# Patient Record
Sex: Male | Born: 1990 | Race: Black or African American | Hispanic: No | Marital: Single | Smoking: Current every day smoker
Health system: Southern US, Community
[De-identification: ages and names within clinical notes are randomized; demographics above are authoritative.]

## PROBLEM LIST (undated history)

## (undated) DIAGNOSIS — T7840XA Allergy, unspecified, initial encounter: Secondary | ICD-10-CM

## (undated) DIAGNOSIS — Z8711 Personal history of peptic ulcer disease: Secondary | ICD-10-CM

## (undated) HISTORY — DX: Allergy, unspecified, initial encounter: T78.40XA

---

## 2015-11-01 ENCOUNTER — Emergency Department (HOSPITAL_BASED_OUTPATIENT_CLINIC_OR_DEPARTMENT_OTHER)
Admission: EM | Admit: 2015-11-01 | Discharge: 2015-11-01 | Disposition: A | Discharge status: Home / Self Care | Payer: Self-pay | Attending: Emergency Medicine | Admitting: Emergency Medicine

## 2015-11-01 ENCOUNTER — Encounter (HOSPITAL_BASED_OUTPATIENT_CLINIC_OR_DEPARTMENT_OTHER): Payer: Self-pay

## 2015-11-01 ENCOUNTER — Emergency Department (HOSPITAL_BASED_OUTPATIENT_CLINIC_OR_DEPARTMENT_OTHER): Payer: Self-pay

## 2015-11-01 DIAGNOSIS — F17210 Nicotine dependence, cigarettes, uncomplicated: Secondary | ICD-10-CM | POA: Insufficient documentation

## 2015-11-01 DIAGNOSIS — J029 Acute pharyngitis, unspecified: Secondary | ICD-10-CM | POA: Insufficient documentation

## 2015-11-01 LAB — RAPID STREP SCREEN (MED CTR MEBANE ONLY): STREPTOCOCCUS, GROUP A SCREEN (DIRECT): NEGATIVE

## 2015-11-01 MED ORDER — KETOROLAC TROMETHAMINE 60 MG/2ML IM SOLN
60.0000 mg | Freq: Once | INTRAMUSCULAR | Status: AC
  Administered 2015-11-01: 60 mg via INTRAMUSCULAR
  Filled 2015-11-01: qty 2

## 2015-11-01 NOTE — Discharge Instructions (Signed)
Use OTC medications such as tylenol or motrin for pain control. Use chloraseptic spray for your sore throat. This will numb your throat to provide relief. Return to ED with new, worsening or concerning symptoms.    Pharyngitis Pharyngitis is redness, pain, and swelling (inflammation) of your pharynx.  CAUSES  Pharyngitis is usually caused by infection. Most of the time, these infections are from viruses (viral) and are part of a cold. However, sometimes pharyngitis is caused by bacteria (bacterial). Pharyngitis can also be caused by allergies. Viral pharyngitis may be spread from person to person by coughing, sneezing, and personal items or utensils (cups, forks, spoons, toothbrushes). Bacterial pharyngitis may be spread from person to person by more intimate contact, such as kissing.  SIGNS AND SYMPTOMS  Symptoms of pharyngitis include:   Sore throat.   Tiredness (fatigue).   Low-grade fever.   Headache.  Joint pain and muscle aches.  Skin rashes.  Swollen lymph nodes.  Plaque-like film on throat or tonsils (often seen with bacterial pharyngitis). DIAGNOSIS  Your health care provider will ask you questions about your illness and your symptoms. Your medical history, along with a physical exam, is often all that is needed to diagnose pharyngitis. Sometimes, a rapid strep test is done. Other lab tests may also be done, depending on the suspected cause.  TREATMENT  Viral pharyngitis will usually get better in 3-4 days without the use of medicine. Bacterial pharyngitis is treated with medicines that kill germs (antibiotics).  HOME CARE INSTRUCTIONS   Drink enough water and fluids to keep your urine clear or pale yellow.   Only take over-the-counter or prescription medicines as directed by your health care provider:   If you are prescribed antibiotics, make sure you finish them even if you start to feel better.   Do not take aspirin.   Get lots of rest.   Gargle with 8 oz  of salt water ( tsp of salt per 1 qt of water) as often as every 1-2 hours to soothe your throat.   Throat lozenges (if you are not at risk for choking) or sprays may be used to soothe your throat. SEEK MEDICAL CARE IF:   You have large, tender lumps in your neck.  You have a rash.  You cough up green, yellow-brown, or bloody spit. SEEK IMMEDIATE MEDICAL CARE IF:   Your neck becomes stiff.  You drool or are unable to swallow liquids.  You vomit or are unable to keep medicines or liquids down.  You have severe pain that does not go away with the use of recommended medicines.  You have trouble breathing (not caused by a stuffy nose). MAKE SURE YOU:   Understand these instructions.  Will watch your condition.  Will get help right away if you are not doing well or get worse.   This information is not intended to replace advice given to you by your health care provider. Make sure you discuss any questions you have with your health care provider.   Document Released: 08/14/2005 Document Revised: 06/04/2013 Document Reviewed: 04/21/2013 Elsevier Interactive Patient Education [employer]2016 [employer] Inc[employer]

## 2015-11-01 NOTE — ED Provider Notes (Signed)
CSN: [id number]     Arrival date & time 11/01/15  1417 History   First MD Initiated Contact with Patient 11/01/15 1535     Chief Complaint  Patient presents with  . Sore Throat   HPI  [name] is a 25 year old male presenting with sore throat and cough. He reports onset of symptoms 3 days ago. States that his throat is constantly aching which increases when he tries to swallow. He denies difficulty handling secretions, swallowing or breathing. He states that the pain seems to be worsening. He is also complaining of a cough. He states that occasionally it is dry but sometimes he coughs up rust colored sputum. He is also complaining of generalized myalgias, fatigue and generalized weakness. He denies known sick contacts. He has not taken any medications prior to arrival. He is an every day cigarette and marijuana smoker. Denies fevers, chills, headache, dizziness, syncope, eye pain, nasal congestion, rhinorrhea, neck pain, neck stiffness, chest pain, SOB, abdominal pain, nausea or vomiting. He did not get his flu shot this year.   History reviewed. No pertinent past medical history. History reviewed. No pertinent past surgical history. No family history on file. Social History  Substance Use Topics  . Smoking status: Current Every Day Smoker -- 0.50 packs/day    Types: Cigarettes  . Smokeless tobacco: None  . Alcohol Use: Yes    Review of Systems  Constitutional: Positive for fatigue.  HENT: Positive for sore throat.   Respiratory: Positive for cough.   Musculoskeletal: Positive for myalgias.  Neurological: Positive for weakness.  All other systems reviewed and are negative.     Allergies  Review of patient's allergies indicates no known allergies.  Home Medications   Prior to Admission medications   Not on File   BP 133/89 mmHg  Pulse 82  Temp(Src) 99 F (37.2 C) (Oral)  Resp 16  Ht 5\' 7"  (1.702 m)  Wt 58.968 kg  BMI 20.36 kg/m2  SpO2 100% Physical Exam   Constitutional: He appears well-developed and well-nourished. No distress.  Nontoxic appearing.   HENT:  Head: Normocephalic and atraumatic.  Nose: Nose normal. No mucosal edema or rhinorrhea. Right sinus exhibits no maxillary sinus tenderness and no frontal sinus tenderness. Left sinus exhibits no maxillary sinus tenderness and no frontal sinus tenderness.  Mouth/Throat: Uvula is midline. No uvula swelling. Posterior oropharyngeal erythema present. No oropharyngeal exudate, posterior oropharyngeal edema or tonsillar abscesses.  Eyes: Conjunctivae are normal. Right eye exhibits no discharge. Left eye exhibits no discharge. No scleral icterus.  Neck: Normal range of motion.  Cardiovascular: Normal rate, regular rhythm and normal heart sounds.   Pulmonary/Chest: Effort normal and breath sounds normal. No respiratory distress. He has no wheezes. He has no rales.  Musculoskeletal: Normal range of motion.  Neurological: He is alert. Coordination normal.  Skin: Skin is warm and dry.  Psychiatric: He has a normal mood and affect. His behavior is normal.  Nursing note and vitals reviewed.   ED Course  Procedures (including critical care time) Labs Review Labs Reviewed  RAPID STREP SCREEN (NOT [facility])  CULTURE, GROUP A STREP [facility])    Imaging Review Dg Chest 2 View  11/01/2015  CLINICAL DATA:  Productive cough shortness of breath and chest pain for 3 days, patient smokes EXAM: CHEST  2 VIEW COMPARISON:  None. FINDINGS: The heart size and mediastinal contours are within normal limits. Both lungs are clear. The visualized skeletal structures are unremarkable. IMPRESSION: No active cardiopulmonary disease. Electronically Signed  By: Esperanza Heir M.D.   On: 11/01/2015 16:09   I have personally reviewed and evaluated these images and lab results as part of my medical decision-making.   EKG Interpretation None      MDM   Final diagnoses:  Viral pharyngitis   Pt presenting with cough,  myalgias, sore throat and odynophagia. VSS. Oropharyngeal erythema without exudate. No uvular edema or deviation. No trismus. No cervical lymphadenopathy. Negative strep. Negative CXR. Likely viral pharyngitis. No abx indicated. DC w symptomatic tx for pain to include OTC pain relievers (tylenol, motrin) and chloraseptic spray.  Pt does not appear dehydrated, but did discuss importance of water rehydration. Presentation not concerning for PTA or infxn spread to soft tissues of the neck. Pt able to tolerate PO intake in the ED. Return precautions given in discharge paperwork and discussed with pt at bedside. Pt stable for discharge      Alveta Heimlich, PA-C 11/01/15 1637  Nelva Nay, MD 11/02/15 [phone]

## 2015-11-01 NOTE — ED Notes (Signed)
Pt reports sore throat worsening x 3 days.

## 2015-11-03 LAB — CULTURE, GROUP A STREP (THRC)

## 2016-08-08 ENCOUNTER — Encounter (HOSPITAL_BASED_OUTPATIENT_CLINIC_OR_DEPARTMENT_OTHER): Payer: Self-pay

## 2016-08-08 ENCOUNTER — Emergency Department (HOSPITAL_BASED_OUTPATIENT_CLINIC_OR_DEPARTMENT_OTHER)
Admission: EM | Admit: 2016-08-08 | Discharge: 2016-08-08 | Disposition: A | Discharge status: Home / Self Care | Payer: Self-pay | Attending: Emergency Medicine | Admitting: Emergency Medicine

## 2016-08-08 DIAGNOSIS — F17210 Nicotine dependence, cigarettes, uncomplicated: Secondary | ICD-10-CM | POA: Insufficient documentation

## 2016-08-08 DIAGNOSIS — J020 Streptococcal pharyngitis: Secondary | ICD-10-CM | POA: Insufficient documentation

## 2016-08-08 LAB — RAPID STREP SCREEN (MED CTR MEBANE ONLY): STREPTOCOCCUS, GROUP A SCREEN (DIRECT): POSITIVE — AB

## 2016-08-08 MED ORDER — PENICILLIN G BENZATHINE 1200000 UNIT/2ML IM SUSP
1.2000 10*6.[IU] | Freq: Once | INTRAMUSCULAR | Status: AC
  Administered 2016-08-08: 1.2 10*6.[IU] via INTRAMUSCULAR
  Filled 2016-08-08: qty 2

## 2016-08-08 NOTE — ED Triage Notes (Signed)
Sore throat x 4 days-NAD-steady gait

## 2016-08-08 NOTE — Discharge Instructions (Signed)
You have been treated for strep throat. You may use over-the-counter Chloraseptic and/or salt water gargles to help with throat pain.

## 2016-08-08 NOTE — ED Provider Notes (Signed)
[facility]   CSN: [id number] Arrival date & time: 08/08/16  1146     History   Chief Complaint Chief Complaint  Patient presents with  . Sore Throat    HPI Melvin Levine is a 25 y.o. male.  The history is provided by the patient and medical records.  Sore Throat    25 y.o. M here with sore throat.  Reports this started on Saturday, has been progressively Worsening. Reports wife and daughter have been sick with similar symptoms. States he has felt feverish, but has not checked his temperature. States a significant amount of pain when trying to eat or drink. He has not had any difficult swallowing. No cough, chest pain, shortness of breath, nasal congestion, or ear pain.  History reviewed. No pertinent past medical history.  There are no active problems to display for this patient.   History reviewed. No pertinent surgical history.     Home Medications    Prior to Admission medications   Not on File    Family History No family history on file.  Social History Social History  Substance Use Topics  . Smoking status: Current Every Day Smoker    Packs/day: 0.50    Types: Cigarettes  . Smokeless tobacco: Never Used  . Alcohol use No     Allergies   Patient has no known allergies.   Review of Systems Review of Systems  HENT: Positive for sore throat.   All other systems reviewed and are negative.    Physical Exam Updated Vital Signs BP 141/83 (BP Location: Left Arm)   Pulse 86   Temp 98.4 F (36.9 C) (Oral)   Resp 18   Ht 5\' 8"  (1.727 m)   Wt 59 kg   SpO2 100%   BMI 19.77 kg/m   Physical Exam  Constitutional: He is oriented to person, place, and time. He appears well-developed and well-nourished.  HENT:  Head: Normocephalic and atraumatic.  Mouth/Throat: Oropharynx is clear and moist.  Tonsils 2+ bilaterally with exudates; uvula midline without evidence of peritonsillar abscess; handling secretions appropriately; no  difficulty swallowing or speaking; normal phonation without stridor  Eyes: Conjunctivae and EOM are normal. Pupils are equal, round, and reactive to light.  Neck: Normal range of motion.  Cardiovascular: Normal rate, regular rhythm and normal heart sounds.   Pulmonary/Chest: Effort normal and breath sounds normal.  Abdominal: Soft. Bowel sounds are normal.  Musculoskeletal: Normal range of motion.  Neurological: He is alert and oriented to person, place, and time.  Skin: Skin is warm and dry.  Psychiatric: He has a normal mood and affect.  Nursing note and vitals reviewed.    ED Treatments / Results  Labs (all labs ordered are listed, but only abnormal results are displayed) Labs Reviewed  RAPID STREP SCREEN (NOT AT [facility]) - Abnormal; Notable for the following:       Result Value   Streptococcus, Group A Screen (Direct) POSITIVE (*)    All other components within normal limits    EKG  EKG Interpretation None       Radiology No results found.  Procedures Procedures (including critical care time)  Medications Ordered in ED Medications - No data to display   Initial Impression / Assessment and Plan / ED Course  I have reviewed the triage vital signs and the nursing notes.  Pertinent labs & imaging results that were available during my care of the patient were reviewed by me and considered in my  medical decision making (see chart for details).  Clinical Course    [age] year old male here with sore throat for the past several days. Daughter and wife have been sick as well. He is afebrile and nontoxic. Tonsils are 2+ bilaterally with exudates noted. Uvula remains midline. Handling secretions well, normal phonation without stridor. Rapid strep test is positive. No signs or symptoms concerning for peritonsillar abscess at this time. Patient was treated here with Bicillin. Will discharge home with supportive care.  Discussed plan with patient, he acknowledged understanding and  agreed with plan of care.  Return precautions given for new or worsening symptoms.  Final Clinical Impressions(s) / ED Diagnoses   Final diagnoses:  Strep pharyngitis    New Prescriptions New Prescriptions   No medications on file     Garlon HatchetLisa M Sanders, Cordelia Poche-C 08/08/16 1321    Canary Brimhristopher J Tegeler, MD 08/08/16 2037

## 2017-08-28 HISTORY — PX: MANDIBLE SURGERY: SHX707

## 2017-08-28 HISTORY — PX: SHOULDER ARTHROSCOPY W/ ROTATOR CUFF REPAIR: SHX2400

## 2017-10-04 ENCOUNTER — Other Ambulatory Visit: Payer: Self-pay

## 2017-10-04 ENCOUNTER — Emergency Department (HOSPITAL_BASED_OUTPATIENT_CLINIC_OR_DEPARTMENT_OTHER): Payer: Self-pay

## 2017-10-04 ENCOUNTER — Encounter (HOSPITAL_BASED_OUTPATIENT_CLINIC_OR_DEPARTMENT_OTHER): Payer: Self-pay | Admitting: *Deleted

## 2017-10-04 ENCOUNTER — Emergency Department (HOSPITAL_BASED_OUTPATIENT_CLINIC_OR_DEPARTMENT_OTHER)
Admission: EM | Admit: 2017-10-04 | Discharge: 2017-10-04 | Disposition: A | Discharge status: Home / Self Care | Payer: Self-pay | Attending: Emergency Medicine | Admitting: Emergency Medicine

## 2017-10-04 DIAGNOSIS — F17210 Nicotine dependence, cigarettes, uncomplicated: Secondary | ICD-10-CM | POA: Insufficient documentation

## 2017-10-04 DIAGNOSIS — R1031 Right lower quadrant pain: Secondary | ICD-10-CM | POA: Insufficient documentation

## 2017-10-04 LAB — CBC WITH DIFFERENTIAL/PLATELET
BASOS ABS: 0 10*3/uL (ref 0.0–0.1)
Basophils Relative: 0 %
EOS ABS: 0.1 10*3/uL (ref 0.0–0.7)
Eosinophils Relative: 2 %
HCT: 46.6 % (ref 39.0–52.0)
Hemoglobin: 16 g/dL (ref 13.0–17.0)
Lymphocytes Relative: 25 %
Lymphs Abs: 1.4 10*3/uL (ref 0.7–4.0)
MCH: 29.2 pg (ref 26.0–34.0)
MCHC: 34.3 g/dL (ref 30.0–36.0)
MCV: 85 fL (ref 78.0–100.0)
MONO ABS: 0.5 10*3/uL (ref 0.1–1.0)
Monocytes Relative: 9 %
NEUTROS PCT: 64 %
Neutro Abs: 3.4 10*3/uL (ref 1.7–7.7)
PLATELETS: 231 10*3/uL (ref 150–400)
RBC: 5.48 MIL/uL (ref 4.22–5.81)
RDW: 13 % (ref 11.5–15.5)
WBC: 5.4 10*3/uL (ref 4.0–10.5)

## 2017-10-04 LAB — COMPREHENSIVE METABOLIC PANEL
ALK PHOS: 90 U/L (ref 38–126)
ALT: 17 U/L (ref 17–63)
ANION GAP: 6 (ref 5–15)
AST: 18 U/L (ref 15–41)
Albumin: 4.2 g/dL (ref 3.5–5.0)
BILIRUBIN TOTAL: 1.5 mg/dL — AB (ref 0.3–1.2)
BUN: 19 mg/dL (ref 6–20)
CALCIUM: 8.9 mg/dL (ref 8.9–10.3)
CO2: 28 mmol/L (ref 22–32)
Chloride: 103 mmol/L (ref 101–111)
Creatinine, Ser: 0.93 mg/dL (ref 0.61–1.24)
GFR calc non Af Amer: >60 mL/min (ref >60)
Glucose, Bld: 79 mg/dL (ref 65–99)
Potassium: 3.7 mmol/L (ref 3.5–5.1)
SODIUM: 137 mmol/L (ref 135–145)
Total Protein: 8.3 g/dL — ABNORMAL HIGH (ref 6.5–8.1)

## 2017-10-04 LAB — URINALYSIS, ROUTINE W REFLEX MICROSCOPIC
Glucose, UA: NEGATIVE mg/dL
Ketones, ur: 15 mg/dL — AB
NITRITE: NEGATIVE
PROTEIN: 30 mg/dL — AB
Specific Gravity, Urine: 1.025 (ref 1.005–1.030)
pH: 6.5 (ref 5.0–8.0)

## 2017-10-04 LAB — URINALYSIS, MICROSCOPIC (REFLEX)

## 2017-10-04 LAB — OCCULT BLOOD X 1 CARD TO LAB, STOOL: FECAL OCCULT BLD: NEGATIVE

## 2017-10-04 LAB — LIPASE, BLOOD: Lipase: 19 U/L (ref 11–51)

## 2017-10-04 MED ORDER — SODIUM CHLORIDE 0.9 % IV BOLUS (SEPSIS)
500.0000 mL | Freq: Once | INTRAVENOUS | Status: AC
  Administered 2017-10-04: 500 mL via INTRAVENOUS

## 2017-10-04 MED ORDER — IOPAMIDOL (ISOVUE-300) INJECTION 61%
100.0000 mL | Freq: Once | INTRAVENOUS | Status: AC | PRN
  Administered 2017-10-04: 100 mL via INTRAVENOUS

## 2017-10-04 MED ORDER — PANTOPRAZOLE SODIUM 20 MG PO TBEC: 20.0000 mg | DELAYED_RELEASE_TABLET | Freq: Every day | ORAL | 1 refills | Status: DC

## 2017-10-04 MED ORDER — SUCRALFATE 1 G PO TABS: 1.0000 g | ORAL_TABLET | Freq: Three times a day (TID) | ORAL | 0 refills | Status: DC

## 2017-10-04 MED FILL — SUCRALFATE 1 GM TABLET: 1 | 14 days supply | Qty: 56 | Fill #0

## 2017-10-04 MED FILL — PANTOPRAZOLE SOD DR 20 MG T: 20 | 30 days supply | Qty: 30 | Fill #0

## 2017-10-04 NOTE — Discharge Instructions (Signed)

## 2017-10-04 NOTE — ED Notes (Signed)
Patient returned to room from CT. 

## 2017-10-04 NOTE — ED Notes (Signed)
ED Provider at bedside. 

## 2017-10-04 NOTE — ED Provider Notes (Signed)
Emergency Department Provider Note   I have reviewed the triage vital signs and the nursing notes.   HISTORY  Chief Complaint Abdominal Pain   HPI Melvin Levine is a 27 y.o. male presents to the emergency department for evaluation of continued abdominal pain in the right lower quadrant and blood in the stools.  She states that 2 weeks ago he went on a drinking binge for his birthday.  He has not continued to drink but since that time is had right-sided abdominal pain and is seen blood in the bowel movements.  He continues to have regular bowel movements but states sometimes he sees light red blood and other times it is dark red.  He denies any black, tarry stools.  Denies rectal pain.  He does have a history of hemorrhoids.  Denies fevers or chills.  No epigastric abdominal pain.  No nausea or vomiting. No sick contacts. No history of GI bleeding in the past.   History reviewed. No pertinent past medical history.  There are no active problems to display for this patient.   History reviewed. No pertinent surgical history.    Allergies Patient has no known allergies.  No family history on file.  Social History Social History   Tobacco Use  . Smoking status: Current Every Day Smoker    Packs/day: 0.50    Types: Cigarettes  . Smokeless tobacco: Never Used  Substance Use Topics  . Alcohol use: No  . Drug use: No    Review of Systems  Constitutional: No fever/chills Eyes: No visual changes. ENT: No sore throat. Cardiovascular: Denies chest pain. Respiratory: Denies shortness of breath. Gastrointestinal: Positive RLQ abdominal pain.  No nausea, no vomiting.  No diarrhea.  No constipation. Positive blood in the stool.  Genitourinary: Negative for dysuria. Musculoskeletal: Negative for back pain. Skin: Negative for rash. Neurological: Negative for headaches, focal weakness or numbness.  10-point ROS otherwise  negative.  ____________________________________________   PHYSICAL EXAM:  VITAL SIGNS: ED Triage Vitals  Enc Vitals Group     BP 10/04/17 1148 134/88     Pulse Rate 10/04/17 1148 70     Resp 10/04/17 1148 18     Temp 10/04/17 1148 98.3 F (36.8 C)     Temp Source 10/04/17 1148 Oral     SpO2 10/04/17 1148 99 %     Weight 10/04/17 1147 130 lb (59 kg)     Height 10/04/17 1147 5\' 8"  (1.727 m)     Pain Score 10/04/17 1147 6   Constitutional: Alert and oriented. Well appearing and in no acute distress. Eyes: Conjunctivae are normal. Head: Atraumatic. Nose: No congestion/rhinnorhea. Mouth/Throat: Mucous membranes are moist.   Neck: No stridor.   Cardiovascular: Normal rate, regular rhythm. Good peripheral circulation. Grossly normal heart sounds.   Respiratory: Normal respiratory effort.  No retractions. Lungs CTAB. Gastrointestinal: Soft with focal RLQ abdominal pain. No distention. Rectal exam performed with nurse present. Normal exam. No gross blood or melena. No external hemorrhoids. No fissures. No abscesses.  Musculoskeletal: No lower extremity tenderness nor edema. No gross deformities of extremities. Neurologic:  Normal speech and language. No gross focal neurologic deficits are appreciated.  Skin:  Skin is warm, dry and intact. No rash noted.  ____________________________________________   LABS (all labs ordered are listed, but only abnormal results are displayed)  Labs Reviewed  COMPREHENSIVE METABOLIC PANEL - Abnormal; Notable for the following components:      Result Value   Total Protein 8.3 (*)  Total Bilirubin 1.5 (*)    All other components within normal limits  URINALYSIS, ROUTINE W REFLEX MICROSCOPIC - Abnormal; Notable for the following components:   Hgb urine dipstick TRACE (*)    Bilirubin Urine SMALL (*)    Ketones, ur 15 (*)    Protein, ur 30 (*)    Leukocytes, UA TRACE (*)    All other components within normal limits  URINALYSIS, MICROSCOPIC  (REFLEX) - Abnormal; Notable for the following components:   Bacteria, UA FEW (*)    Squamous Epithelial / LPF 0-5 (*)    All other components within normal limits  LIPASE, BLOOD  CBC WITH DIFFERENTIAL/PLATELET  OCCULT BLOOD X 1 CARD TO LAB, STOOL  POC OCCULT BLOOD, ED   ____________________________________________  RADIOLOGY  Ct Abdomen Pelvis W Contrast  Result Date: 10/04/2017 CLINICAL DATA:  RIGHT lower quadrant pain for 4 days, some bright red blood in stools EXAM: CT ABDOMEN AND PELVIS WITH CONTRAST TECHNIQUE: Multidetector CT imaging of the abdomen and pelvis was performed using the standard protocol following bolus administration of intravenous contrast. Sagittal and coronal MPR images reconstructed from axial data set. CONTRAST:  ISOVUE-300 IOPAMIDOL (ISOVUE-300) INJECTION 61% IV. Dilute oral contrast. COMPARISON:  08/14/2014 FINDINGS: Lower chest: Lung bases clear Hepatobiliary: Gallbladder and liver normal appearance Pancreas: Normal appearance Spleen: Normal appearance Adrenals/Urinary Tract: Adrenal glands, kidneys, and decompressed bladder normal appearance. Ureters poorly visualized, no definite calculi seen along the expected courses of the ureters. Stomach/Bowel: Question wall thickening of the distal gastric antrum/pylorus less likely artifact related to underdistention. Mild gaseous distention of rectum. Short segment of appendix is visualized and normal in appearance, remainder obscured by adjacent bowel loops. No definite pericecal inflammatory process. Bowel loops otherwise unremarkable. Vascular/Lymphatic: Vascular structures unremarkable. No definite adenopathy. Reproductive: N/A Other: No free air or free fluid.  No hernia. Musculoskeletal: Bones unremarkable. IMPRESSION: Questionable mild wall thickening of the distal gastric antrum and pylorus which could be related to gastritis or ulcer disease. Remainder of exam unremarkable. Electronically Signed   By: Ulyses Southward  M.D.   On: 10/04/2017 13:00    ____________________________________________   PROCEDURES  Procedure(s) performed:   Procedures  None ____________________________________________   INITIAL IMPRESSION / ASSESSMENT AND PLAN / ED COURSE  Pertinent labs & imaging results that were available during my care of the patient were reviewed by me and considered in my medical decision making (see chart for details).  Patient presents to the emergency department for evaluation of right lower quadrant abdominal pain with report of blood in the stools.  Symptoms progressing over the last 2 weeks.  He has focal tenderness in the right lower quadrant without rebound or guarding.  Rectal exam is largely unremarkable.  No fever or chills.  Plan for CT to evaluate for underlying colitis with complication.  Labs pending.  01:23 PM Scan reviewed with findings consistent with gastritis.  No explanation for the right lower quadrant pain.  No blood on Hemoccult.  Labs otherwise unremarkable.  Plan for treatment of ulcer disease and referral to primary care physician.  Patient may require additional follow-up with gastroenterology after seeing the primary care physician. Discussed this with him in detail.   At this time, I do not feel there is any life-threatening condition present. I have reviewed and discussed all results (EKG, imaging, lab, urine as appropriate), exam findings with patient. I have reviewed nursing notes and appropriate previous records.  I feel the patient is safe to be discharged home without  further emergent workup. Discussed usual and customary return precautions. Patient and family (if present) verbalize understanding and are comfortable with this plan.  Patient will follow-up with their primary care provider. If they do not have a primary care provider, information for follow-up has been provided to them. All questions have been  answered.  ____________________________________________  FINAL CLINICAL IMPRESSION(S) / ED DIAGNOSES  Final diagnoses:  Right lower quadrant abdominal pain     MEDICATIONS GIVEN DURING THIS VISIT:  Medications  sodium chloride 0.9 % bolus 500 mL (0 mLs Intravenous Stopped 10/04/17 1254)  iopamidol (ISOVUE-300) 61 % injection 100 mL (100 mLs Intravenous Contrast Given 10/04/17 1240)     NEW OUTPATIENT MEDICATIONS STARTED DURING THIS VISIT:  Discharge Medication List as of 10/04/2017  1:26 PM    START taking these medications   Details  pantoprazole (PROTONIX) 20 MG tablet Take 1 tablet (20 mg total) by mouth daily., Starting Thu 10/04/2017, Until Sat 11/03/2017, Print    sucralfate (CARAFATE) 1 g tablet Take 1 tablet (1 g total) by mouth 4 (four) times daily -  with meals and at bedtime for 14 days., Starting Thu 10/04/2017, Until Thu 10/18/2017, Print        Note:  This document was prepared using Dragon voice recognition software and may include unintentional dictation errors.  Alona Bene, MD Emergency Medicine    Alistar Mcenery, Arlyss Repress, MD 10/04/17 [phone]

## 2017-10-04 NOTE — ED Triage Notes (Signed)
Abdominal pain since drinking a large amount of alcohol a week ago. States he sees bright red blood in his stools at times.

## 2017-12-09 ENCOUNTER — Encounter (HOSPITAL_BASED_OUTPATIENT_CLINIC_OR_DEPARTMENT_OTHER): Payer: Self-pay | Admitting: *Deleted

## 2017-12-09 ENCOUNTER — Emergency Department (HOSPITAL_BASED_OUTPATIENT_CLINIC_OR_DEPARTMENT_OTHER)
Admission: EM | Admit: 2017-12-09 | Discharge: 2017-12-09 | Disposition: A | Discharge status: Home / Self Care | Payer: Medicaid Other | Attending: Emergency Medicine | Admitting: Emergency Medicine

## 2017-12-09 ENCOUNTER — Other Ambulatory Visit: Payer: Self-pay

## 2017-12-09 DIAGNOSIS — N4889 Other specified disorders of penis: Secondary | ICD-10-CM | POA: Insufficient documentation

## 2017-12-09 DIAGNOSIS — R21 Rash and other nonspecific skin eruption: Secondary | ICD-10-CM | POA: Insufficient documentation

## 2017-12-09 DIAGNOSIS — Z8619 Personal history of other infectious and parasitic diseases: Secondary | ICD-10-CM | POA: Insufficient documentation

## 2017-12-09 DIAGNOSIS — Z113 Encounter for screening for infections with a predominantly sexual mode of transmission: Secondary | ICD-10-CM | POA: Insufficient documentation

## 2017-12-09 LAB — URINALYSIS, MICROSCOPIC (REFLEX)

## 2017-12-09 LAB — URINALYSIS, ROUTINE W REFLEX MICROSCOPIC
Glucose, UA: NEGATIVE mg/dL
Ketones, ur: 15 mg/dL — AB
NITRITE: NEGATIVE
Protein, ur: 30 mg/dL — AB
pH: 6 (ref 5.0–8.0)

## 2017-12-09 MED ORDER — PENICILLIN G BENZATHINE 1200000 UNIT/2ML IM SUSP
1.2000 10*6.[IU] | Freq: Once | INTRAMUSCULAR | Status: AC
  Administered 2017-12-09: 1.2 10*6.[IU] via INTRAMUSCULAR
  Filled 2017-12-09: qty 2

## 2017-12-09 MED ORDER — DOXYCYCLINE HYCLATE 100 MG PO CAPS: 100.0000 mg | ORAL_CAPSULE | Freq: Two times a day (BID) | ORAL | 0 refills | Status: DC

## 2017-12-09 NOTE — ED Provider Notes (Signed)
[facility] EMERGENCY DEPARTMENT Provider Note   CSN: [id number] Arrival date & time: 12/09/17  1651     History   Chief Complaint Chief Complaint  Patient presents with  . Penile Discharge    HPI Melvin Levine is a 27 y.o. male.  HPI   27 year old male presenting for evaluation of a rash.  Patient states 3 days ago he started noticing a rash around his neck.  The next day rash appears in his anterior abdomen and now he noticed rash at the tip of his penis.  The rash is not really itchy or painful.  He did report having somewhat of a similar rash before.  He denies any associated fever, headache, trouble swallowing, chest pain, trouble breathing, abdominal cramping, dysuria, hematuria, penile discharge or testicular pain.  He does admits to having multiple male partners and was diagnosed with gonorrhea a month ago and received treatment for that.  He denies any change in his environment, no new soap, detergent, body rash or new injuries.   History reviewed. No pertinent past medical history.  There are no active problems to display for this patient.   History reviewed. No pertinent surgical history.      Home Medications    Prior to Admission medications   Medication Sig Start Date End Date Taking? Authorizing Provider  pantoprazole (PROTONIX) 20 MG tablet Take 1 tablet (20 mg total) by mouth daily. 10/04/17 11/03/17  Long, Arlyss RepressJoshua G, MD  sucralfate (CARAFATE) 1 g tablet Take 1 tablet (1 g total) by mouth 4 (four) times daily -  with meals and at bedtime for 14 days. 10/04/17 10/18/17  Long, Arlyss RepressJoshua G, MD    Family History History reviewed. No pertinent family history.  Social History Social History   Tobacco Use  . Smoking status: Current Every Day Smoker    Packs/day: 0.50    Types: Cigarettes  . Smokeless tobacco: Never Used  Substance Use Topics  . Alcohol use: No  . Drug use: Yes    Types: Marijuana     Allergies   Patient has no known  allergies.   Review of Systems Review of Systems  All other systems reviewed and are negative.    Physical Exam Updated Vital Signs BP (!) 141/76 (BP Location: Left Arm)   Pulse 77   Temp 99.1 F (37.3 C) (Oral)   Resp 16   Ht 5\' 7"  (1.702 m)   Wt 61.2 kg (135 lb)   SpO2 100%   BMI 21.14 kg/m   Physical Exam  Constitutional: He appears well-developed and well-nourished. No distress.  HENT:  Mouth/Throat: Oropharynx is clear and moist.  Eyes: Conjunctivae are normal.  Neck: Normal range of motion. Neck supple.  No nuchal rigidity  Pulmonary/Chest: Effort normal.  Abdominal: Soft. There is no tenderness.  Genitourinary:  Genitourinary Comments: Chaperone present during exam.  Circumcised penis.  Multiple skin colored papular lesion noted to the head of penis nontender to palpation.  No inguinal lymphopathy or inguinal hernia noted.  Normal testicle with normal lie, scrotum nontender.  Skin: Rash (Multiple skin colored papules noted surrounding the anterior neck bilaterally without any erythema.  Several 1 cm size raised papule to anterior abdomen with central clearing.) noted.  Nursing note and vitals reviewed.    ED Treatments / Results  Labs (all labs ordered are listed, but only abnormal results are displayed) Labs Reviewed  URINALYSIS, ROUTINE W REFLEX MICROSCOPIC - Abnormal; Notable for the following components:  Result Value   Color, Urine AMBER (*)    Specific Gravity, Urine >1.030 (*)    Hgb urine dipstick TRACE (*)    Bilirubin Urine SMALL (*)    Ketones, ur 15 (*)    Protein, ur 30 (*)    Leukocytes, UA TRACE (*)    All other components within normal limits  URINALYSIS, MICROSCOPIC (REFLEX) - Abnormal; Notable for the following components:   Bacteria, UA RARE (*)    Squamous Epithelial / LPF 0-5 (*)    All other components within normal limits  RPR  HIV ANTIBODY (ROUTINE TESTING)  GC/CHLAMYDIA PROBE AMP ([place]) NOT AT [facility]     EKG None  Radiology No results found.  Procedures Procedures (including critical care time)  Medications Ordered in ED Medications  penicillin g benzathine (BICILLIN LA) 1200000 UNIT/2ML injection 1.2 Million Units (1.2 Million Units Intramuscular Given 12/09/17 1854)     Initial Impression / Assessment and Plan / ED Course  I have reviewed the triage vital signs and the nursing notes.  Pertinent labs & imaging results that were available during my care of the patient were reviewed by me and considered in my medical decision making (see chart for details).     BP (!) 141/76 (BP Location: Left Arm)   Pulse 77   Temp 99.1 F (37.3 C) (Oral)   Resp 16   Ht 5\' 7"  (1.702 m)   Wt 61.2 kg (135 lb)   SpO2 100%   BMI 21.14 kg/m    Final Clinical Impressions(s) / ED Diagnoses   Final diagnoses:  Rash and nonspecific skin eruption    ED Discharge Orders        Ordered    doxycycline (VIBRAMYCIN) 100 MG capsule  2 times daily     12/09/17 1853     6:52 PM Patient with prior history of STI here with multiple abnormal skin rash to his neck, abdomen, and also at the tip of his penis.  No penile discharge joint pain headache or fever.  Given his abnormal rash, or give Biaxin 2,400,000 units to cover for potential syphilis.  STI testing ordered.  Will discharge home with doxycycline to cover for potential STI related rash.  Return precautions discussed.  No environmental changes noted.  Doubt SJS or TEN.  Pt is well appearing.  Return precaution given.     Fayrene Helper, PA-C 12/09/17 1854    Tilden Fossa, MD 12/10/17 [phone]

## 2017-12-09 NOTE — ED Triage Notes (Signed)
Penile rash that started today.

## 2017-12-09 NOTE — Discharge Instructions (Addendum)
You have developed a nonspecific rash.  Please take antibiotic as prescribed to cover for potential infection.  Return if condition worsen or if you have any other concerns.

## 2017-12-10 LAB — GC/CHLAMYDIA PROBE AMP (~~LOC~~) NOT AT ARMC
CHLAMYDIA, DNA PROBE: NEGATIVE
Neisseria Gonorrhea: NEGATIVE

## 2017-12-11 LAB — HIV ANTIBODY (ROUTINE TESTING W REFLEX): HIV Screen 4th Generation wRfx: NONREACTIVE

## 2017-12-11 LAB — RPR: RPR Ser Ql: NONREACTIVE

## 2017-12-13 ENCOUNTER — Other Ambulatory Visit: Payer: Self-pay

## 2017-12-13 ENCOUNTER — Encounter (HOSPITAL_BASED_OUTPATIENT_CLINIC_OR_DEPARTMENT_OTHER): Payer: Self-pay | Admitting: *Deleted

## 2017-12-13 ENCOUNTER — Emergency Department (HOSPITAL_BASED_OUTPATIENT_CLINIC_OR_DEPARTMENT_OTHER)
Admission: EM | Admit: 2017-12-13 | Discharge: 2017-12-13 | Disposition: A | Discharge status: Home / Self Care | Payer: Medicaid Other | Attending: Emergency Medicine | Admitting: Emergency Medicine

## 2017-12-13 DIAGNOSIS — L2082 Flexural eczema: Secondary | ICD-10-CM

## 2017-12-13 DIAGNOSIS — R21 Rash and other nonspecific skin eruption: Secondary | ICD-10-CM

## 2017-12-13 DIAGNOSIS — Z5321 Procedure and treatment not carried out due to patient leaving prior to being seen by health care provider: Secondary | ICD-10-CM | POA: Insufficient documentation

## 2017-12-13 MED ORDER — TRIAMCINOLONE ACETONIDE 0.1 % EX CREA: 1.0000 "application " | TOPICAL_CREAM | Freq: Two times a day (BID) | CUTANEOUS | 0 refills | Status: DC

## 2017-12-13 MED FILL — TRIAMCINOLONE 0.1% CREAM: 0.1 | 30 days supply | Qty: 45 | Fill #0

## 2017-12-13 NOTE — Discharge Instructions (Addendum)
Return here as needed. I would stop the other cream and the antibiotics.

## 2017-12-13 NOTE — ED Triage Notes (Signed)
Rash on his arms, neck and abdomen. States he was seen for same 3 days ago and given medication that has helped but it has not completely gone away. No distress.

## 2017-12-17 NOTE — ED Provider Notes (Signed)
[facility] EMERGENCY DEPARTMENT Provider Note   CSN: [id number] Arrival date & time: 12/13/17  1111     History   Chief Complaint Chief Complaint  Patient presents with  . Rash    HPI Melvin Levine is a 27 y.o. male.  HPI Patient presents to the emergency department with rash to his arms neck and abdomen.  The patient was started on treatment for possible STDs and given antibiotics for this rash.  The patient states that the rash does not seem to be vastly improved at this time.  Patient states some of the rash has gotten better but not completely.  Patient states that he has taken all of the medications as prescribed.  Patient denies having any fever, nausea, vomiting, weakness, dizziness, headache, blurred vision or syncope. History reviewed. No pertinent past medical history.  There are no active problems to display for this patient.   History reviewed. No pertinent surgical history.      Home Medications    Prior to Admission medications   Medication Sig Start Date End Date Taking? Authorizing Provider  doxycycline (VIBRAMYCIN) 100 MG capsule Take 1 capsule (100 mg total) by mouth 2 (two) times daily. One po bid x 7 days 12/09/17  Yes Fayrene Helper, PA-C  pantoprazole (PROTONIX) 20 MG tablet Take 1 tablet (20 mg total) by mouth daily. 10/04/17 11/03/17  Long, Arlyss Repress, MD  sucralfate (CARAFATE) 1 g tablet Take 1 tablet (1 g total) by mouth 4 (four) times daily -  with meals and at bedtime for 14 days. 10/04/17 10/18/17  Long, Arlyss Repress, MD  triamcinolone cream (KENALOG) 0.1 % Apply 1 application topically 2 (two) times daily. 12/13/17   Charlestine Night, PA-C    Family History No family history on file.  Social History Social History   Tobacco Use  . Smoking status: Current Every Day Smoker    Packs/day: 0.50    Types: Cigarettes  . Smokeless tobacco: Never Used  Substance Use Topics  . Alcohol use: No  . Drug use: Yes    Types: Marijuana     Allergies     Patient has no known allergies.   Review of Systems Review of Systems All other systems negative except as documented in the HPI. All pertinent positives and negatives as reviewed in the HPI.  Physical Exam Updated Vital Signs BP (!) 140/94   Pulse 74   Temp 98 F (36.7 C) (Oral)   Resp 18   Ht 5\' 7"  (1.702 m)   Wt 61.2 kg (135 lb)   SpO2 100%   BMI 21.14 kg/m   Physical Exam  Constitutional: He is oriented to person, place, and time. He appears well-developed and well-nourished. No distress.  HENT:  Head: Normocephalic and atraumatic.  Eyes: Pupils are equal, round, and reactive to light.  Pulmonary/Chest: Effort normal.  Neurological: He is alert and oriented to person, place, and time.  Skin: Skin is warm and dry. Rash noted.     Psychiatric: He has a normal mood and affect.  Nursing note and vitals reviewed.    ED Treatments / Results  Labs (all labs ordered are listed, but only abnormal results are displayed) Labs Reviewed - No data to display  EKG None  Radiology No results found.  Procedures Procedures (including critical care time)  Medications Ordered in ED Medications - No data to display   Initial Impression / Assessment and Plan / ED Course  I have reviewed the triage vital signs  and the nursing notes.  Pertinent labs & imaging results that were available during my care of the patient were reviewed by me and considered in my medical decision making (see chart for details).     Patient will be treated for an eczema type rash based on the fact that he has had a history of this in the past along with the appearance of this rash.  Patient is advised that the antibiotics will not help with this rash more than likely.  The patient is advised the plan and all questions were answered. Final Clinical Impressions(s) / ED Diagnoses   Final diagnoses:  Rash  Flexural eczema    ED Discharge Orders        Ordered    triamcinolone cream (KENALOG)  0.1 %  2 times daily     12/13/17 [address]1322       Treyana Sturgell, PA-C 12/17/17 1507    Melene PlanFloyd, Dan, DO 12/17/17 1537

## 2019-06-26 IMAGING — CT CT ABD-PELV W/ CM
2 of 4 series · 16 of 46 positions shown, 18 images · IV contrast (APPLIED)
Comparison: 08/14/2014

CLINICAL DATA: RIGHT lower quadrant pain for 4 days, some bright
red blood in stools

EXAM:
CT ABDOMEN AND PELVIS WITH CONTRAST
TECHNIQUE: Multidetector CT imaging of the abdomen and pelvis was performed
using the standard protocol following bolus administration of
intravenous contrast. Sagittal and coronal MPR images reconstructed
from axial data set.
CONTRAST:  100mL 57DC9L-RFF IOPAMIDOL (57DC9L-RFF) INJECTION 61% IV.
Dilute oral contrast.

[Series 2: axial st · axial · 0.66mm/px · z∈[-434,-54]mm · 13 of 84 slices shown, 15 images]
[im 4/84  soft-tissue]
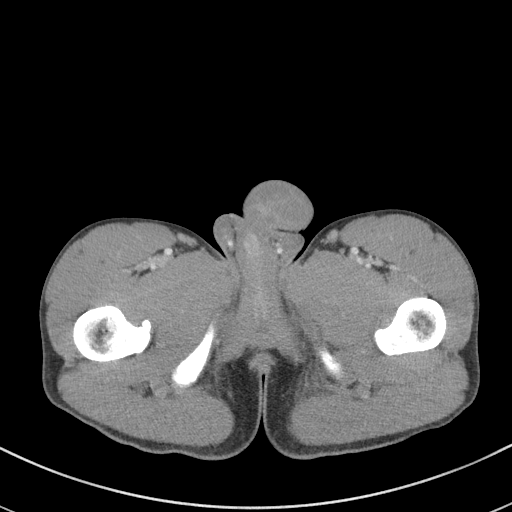
[im 4/84  bone]
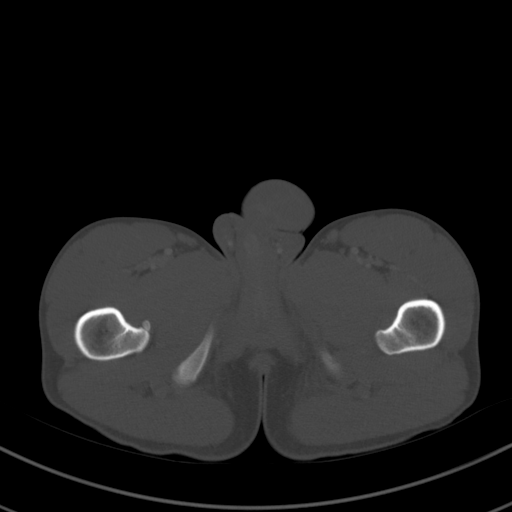
[im 11/84  soft-tissue]
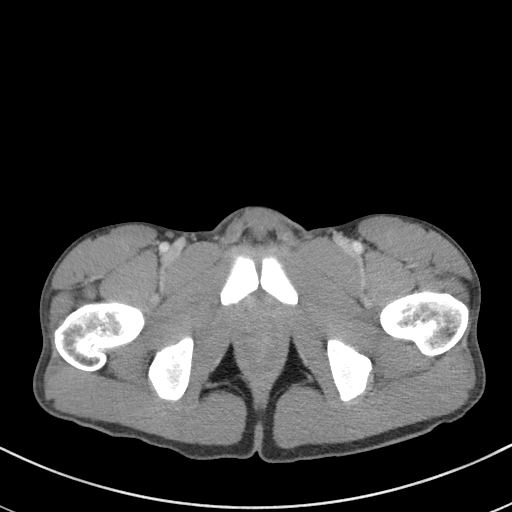
[im 18/84  soft-tissue]
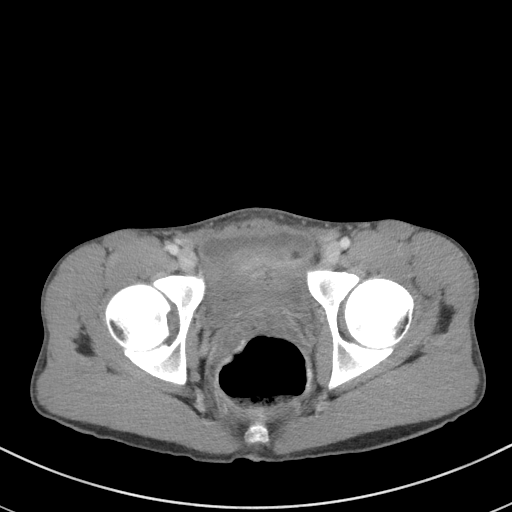
[im 25/84  soft-tissue]
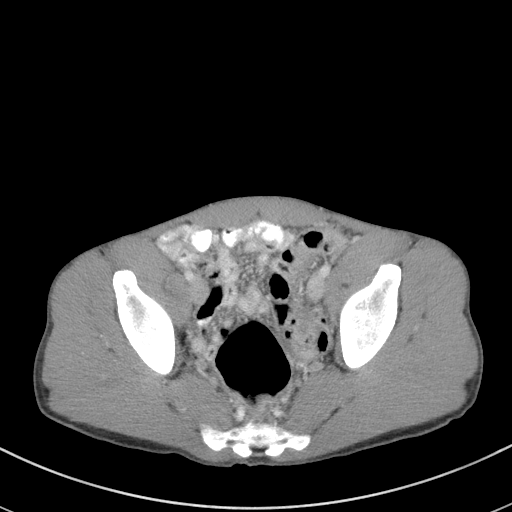
[im 28/84  soft-tissue]
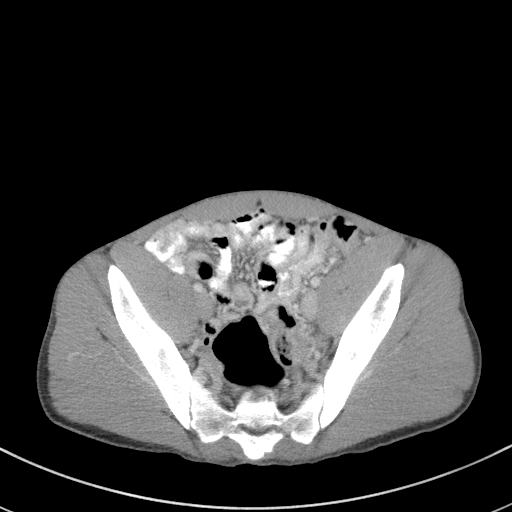
[im 35/84  soft-tissue]
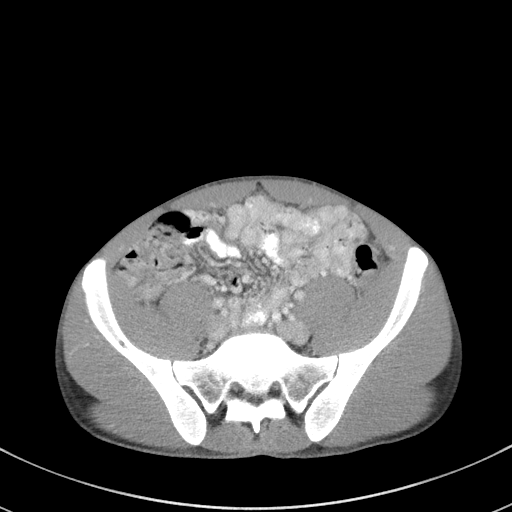
[im 42/84  soft-tissue]
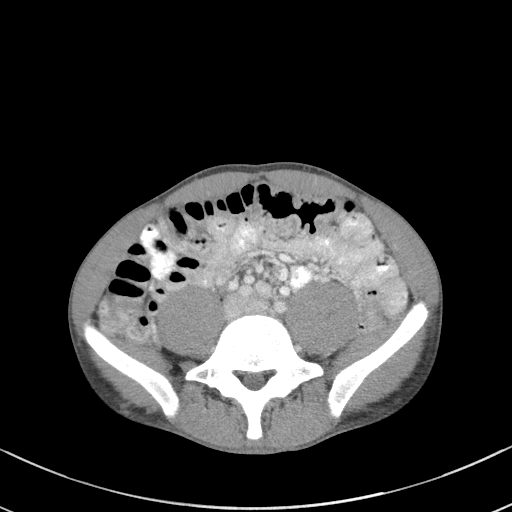
[im 49/84  soft-tissue]
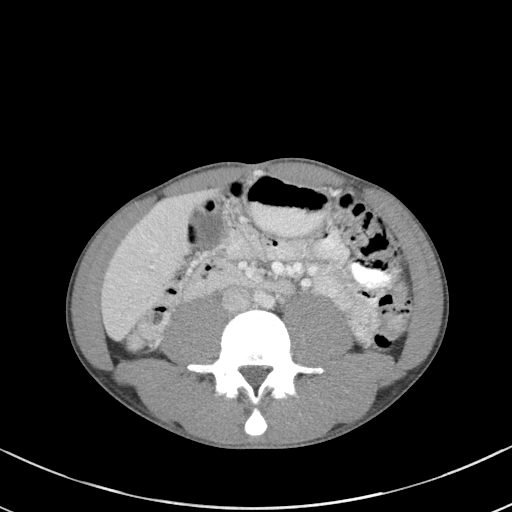
[im 56/84  soft-tissue]
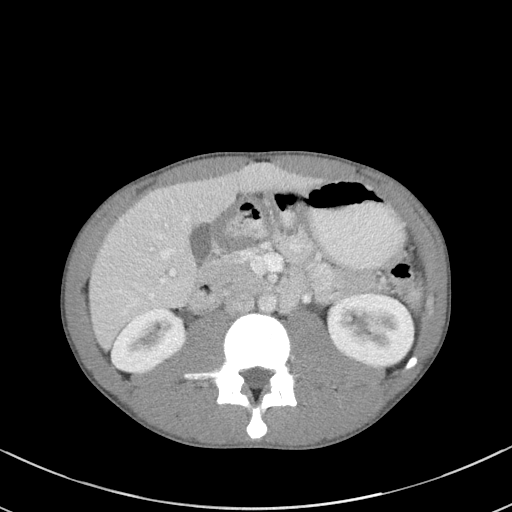
[im 56/84  bone]
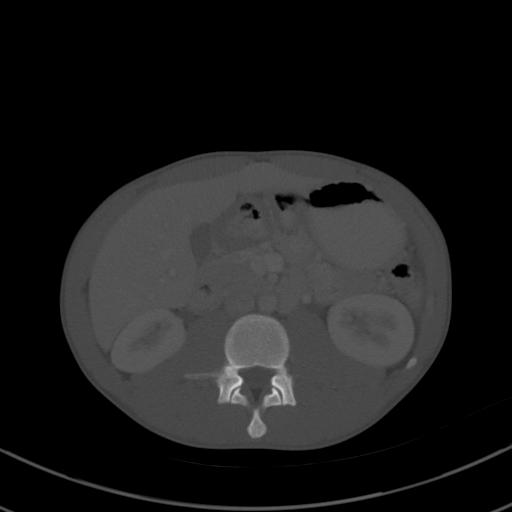
[im 59/84  soft-tissue]
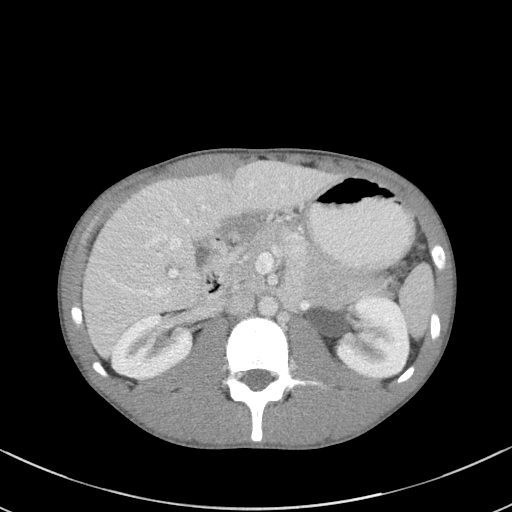
[im 66/84  soft-tissue]
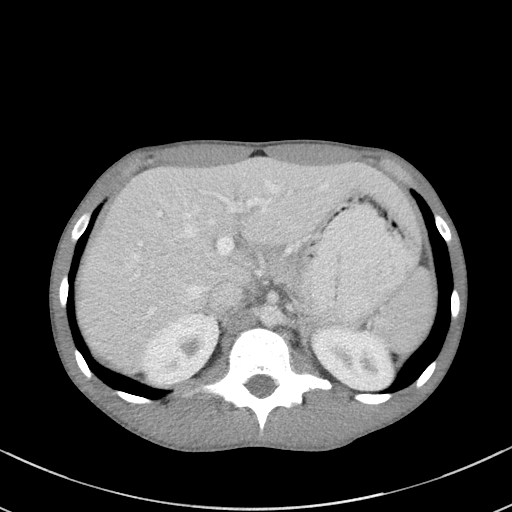
[im 73/84  soft-tissue]
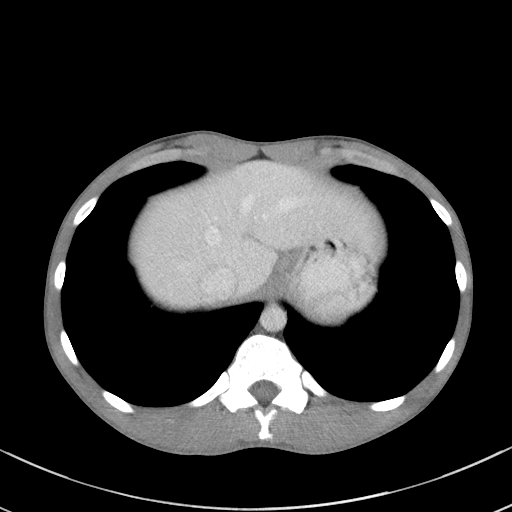
[im 80/84  soft-tissue]
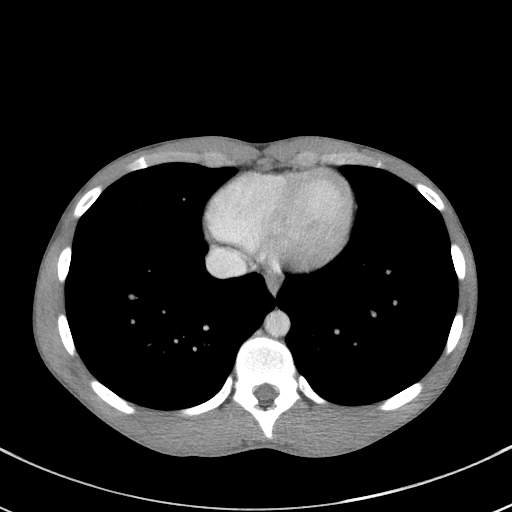

[Series 5: coronal st · coronal · 0.62mm/px · 3 of 74 slices shown]
[im 25/74  soft-tissue]
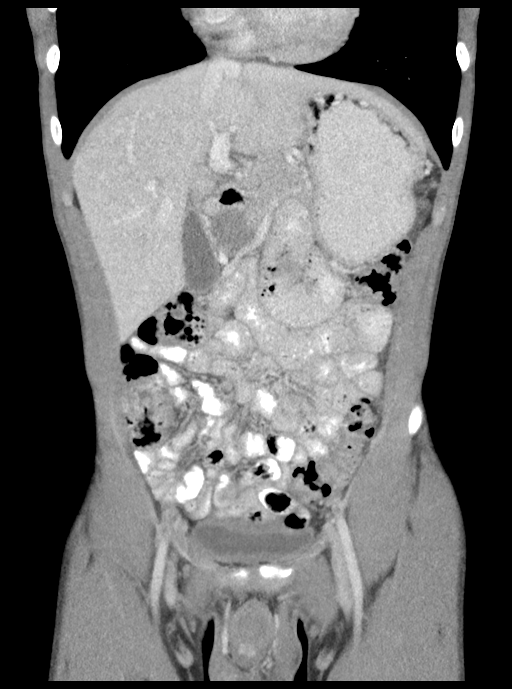
[im 33/74  soft-tissue]
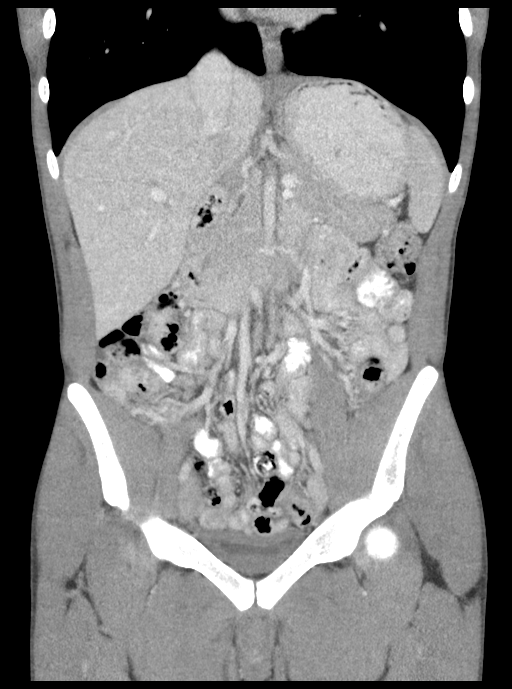
[im 41/74  soft-tissue]
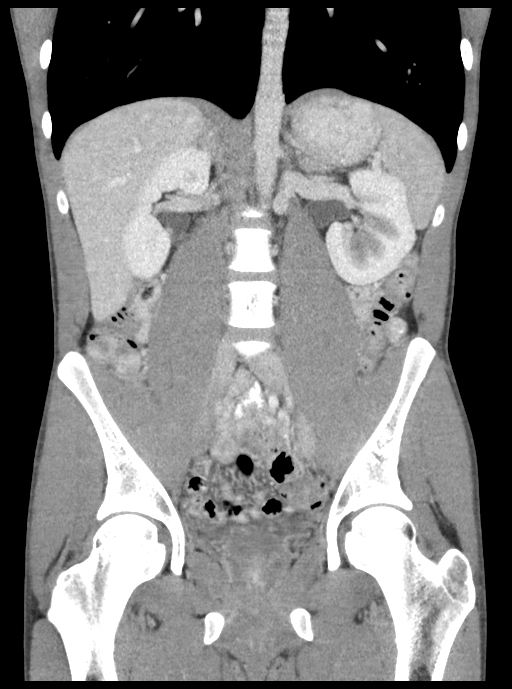

[16 of 46 positions shown; findings below may reference images not displayed]

FINDINGS: Lower chest: Lung bases clear

Hepatobiliary: Gallbladder and liver normal appearance

Pancreas: Normal appearance

Spleen: Normal appearance

Adrenals/Urinary Tract: Adrenal glands, kidneys, and decompressed
bladder normal appearance. Ureters poorly visualized, no definite
calculi seen along the expected courses of the ureters.

Stomach/Bowel: Question wall thickening of the distal gastric
antrum/pylorus less likely artifact related to underdistention. Mild
gaseous distention of rectum. Short segment of appendix is
visualized and normal in appearance, remainder obscured by adjacent
bowel loops. No definite pericecal inflammatory process. Bowel loops
otherwise unremarkable.

Vascular/Lymphatic: Vascular structures unremarkable. No definite
adenopathy.

Reproductive: N/A

Other: No free air or free fluid.  No hernia.

Musculoskeletal: Bones unremarkable.
IMPRESSION: Questionable mild wall thickening of the distal gastric antrum and
pylorus which could be related to gastritis or ulcer disease.

Remainder of exam unremarkable.

## 2023-01-23 ENCOUNTER — Encounter (HOSPITAL_BASED_OUTPATIENT_CLINIC_OR_DEPARTMENT_OTHER): Payer: Self-pay | Admitting: Emergency Medicine

## 2023-01-23 ENCOUNTER — Emergency Department (HOSPITAL_BASED_OUTPATIENT_CLINIC_OR_DEPARTMENT_OTHER)
Admission: EM | Admit: 2023-01-23 | Discharge: 2023-01-23 | Disposition: A | Discharge status: Home / Self Care | Payer: 59 | Attending: Emergency Medicine | Admitting: Emergency Medicine

## 2023-01-23 ENCOUNTER — Emergency Department (HOSPITAL_BASED_OUTPATIENT_CLINIC_OR_DEPARTMENT_OTHER): Payer: 59

## 2023-01-23 ENCOUNTER — Other Ambulatory Visit: Payer: Self-pay

## 2023-01-23 DIAGNOSIS — R7309 Other abnormal glucose: Secondary | ICD-10-CM | POA: Diagnosis not present

## 2023-01-23 DIAGNOSIS — R935 Abnormal findings on diagnostic imaging of other abdominal regions, including retroperitoneum: Secondary | ICD-10-CM | POA: Diagnosis not present

## 2023-01-23 DIAGNOSIS — R110 Nausea: Secondary | ICD-10-CM

## 2023-01-23 DIAGNOSIS — R1012 Left upper quadrant pain: Secondary | ICD-10-CM | POA: Diagnosis present

## 2023-01-23 DIAGNOSIS — K6289 Other specified diseases of anus and rectum: Secondary | ICD-10-CM | POA: Diagnosis not present

## 2023-01-23 DIAGNOSIS — R9389 Abnormal findings on diagnostic imaging of other specified body structures: Secondary | ICD-10-CM

## 2023-01-23 HISTORY — DX: Personal history of peptic ulcer disease: Z87.11

## 2023-01-23 LAB — COMPREHENSIVE METABOLIC PANEL
ALT: 17 U/L (ref 0–44)
AST: 18 U/L (ref 15–41)
Albumin: 3.9 g/dL (ref 3.5–5.0)
Alkaline Phosphatase: 85 U/L (ref 38–126)
Anion gap: 9 (ref 5–15)
BUN: 20 mg/dL (ref 6–20)
CO2: 26 mmol/L (ref 22–32)
Calcium: 8.6 mg/dL — ABNORMAL LOW (ref 8.9–10.3)
Chloride: 101 mmol/L (ref 98–111)
Creatinine, Ser: 1.02 mg/dL (ref 0.61–1.24)
GFR, Estimated: >60 mL/min (ref >60)
Glucose, Bld: 106 mg/dL — ABNORMAL HIGH (ref 70–99)
Potassium: 3.8 mmol/L (ref 3.5–5.1)
Sodium: 136 mmol/L (ref 135–145)
Total Bilirubin: 0.5 mg/dL (ref 0.3–1.2)
Total Protein: 8.1 g/dL (ref 6.5–8.1)

## 2023-01-23 LAB — URINALYSIS, MICROSCOPIC (REFLEX): RBC / HPF: NONE SEEN RBC/hpf (ref 0–5)

## 2023-01-23 LAB — CBC
HCT: 44.9 % (ref 39.0–52.0)
Hemoglobin: 14.7 g/dL (ref 13.0–17.0)
MCH: 27.7 pg (ref 26.0–34.0)
MCHC: 32.7 g/dL (ref 30.0–36.0)
MCV: 84.7 fL (ref 80.0–100.0)
Platelets: 233 10*3/uL (ref 150–400)
RBC: 5.3 MIL/uL (ref 4.22–5.81)
RDW: 14.5 % (ref 11.5–15.5)
WBC: 8.2 10*3/uL (ref 4.0–10.5)
nRBC: 0 % (ref 0.0–0.2)

## 2023-01-23 LAB — URINALYSIS, ROUTINE W REFLEX MICROSCOPIC
Bilirubin Urine: NEGATIVE
Glucose, UA: NEGATIVE mg/dL
Hgb urine dipstick: NEGATIVE
Ketones, ur: NEGATIVE mg/dL
Leukocytes,Ua: NEGATIVE
Nitrite: NEGATIVE
Protein, ur: 30 mg/dL — AB
Specific Gravity, Urine: 1.025 (ref 1.005–1.030)
pH: 7 (ref 5.0–8.0)

## 2023-01-23 LAB — LIPASE, BLOOD: Lipase: 29 U/L (ref 11–51)

## 2023-01-23 MED ORDER — AMOXICILLIN-POT CLAVULANATE 875-125 MG PO TABS
1.0000 | ORAL_TABLET | Freq: Two times a day (BID) | ORAL | 0 refills | Status: DC
  Filled 2023-01-23: qty 14, 7d supply, fill #0

## 2023-01-23 MED ORDER — IOHEXOL 300 MG/ML  SOLN
100.0000 mL | Freq: Once | INTRAMUSCULAR | Status: AC | PRN
  Administered 2023-01-23: 100 mL via INTRAVENOUS

## 2023-01-23 MED ORDER — LACTATED RINGERS IV BOLUS
1000.0000 mL | Freq: Once | INTRAVENOUS | Status: AC
  Administered 2023-01-23: 1000 mL via INTRAVENOUS

## 2023-01-23 MED ORDER — ONDANSETRON HCL 4 MG PO TABS
4.0000 mg | ORAL_TABLET | Freq: Four times a day (QID) | ORAL | 0 refills | Status: DC
  Filled 2023-01-23 – 2023-01-24 (×2): qty 12, 3d supply, fill #0

## 2023-01-23 NOTE — ED Triage Notes (Signed)
Patient arrives ambulatory by POV c/o feeling sick over past 4 days. Reports dizzy, light headed, left lower abdominal pain and blood in his stool. Reports hx of blood in his stool due to ulcers.

## 2023-01-23 NOTE — ED Notes (Signed)
Patient transported to CT 

## 2023-01-23 NOTE — Discharge Instructions (Addendum)
You are seen ER today for evaluation of your abdominal pain as well as the blood in your stool.  It appears he may have something called proctitis.  May be from ulcerative colitis or from other sources.  I would like for you to follow-up with a gastroenterologist or stomach doctor.  Please make sure you call [facility] tomorrow to schedule an appointment.  In the meantime, giving you an antibiotic called Augmentin to take twice a day for the next 7 days.  Will also send you in some Zofran that she can take as needed for nausea.  Please make sure you are drinking plenty of fluids and staying well-hydrated.  I would also try a bland diet as well.  Your imaging shows that you have calcifications in your vessels which is unusual for your age. Please follow up with you PCP for further testing of this. If you have any concerns with new or worsening symptoms, please return to the nearest emergency room for evaluation.  Contact a health care provider if: Your symptoms do not get better with treatment. Your symptoms get worse. You have a fever. Get help right away if: You have blood in your poop. There is blood coming from your rectum.

## 2023-01-23 NOTE — ED Provider Notes (Signed)
[facility] Provider Note   CSN: [id number] Arrival date & time: 01/23/23  1220     History Chief Complaint  Patient presents with   Abdominal Pain    Melvin Levine is a 32 y.o. male with reported history of "ulcers" presents emerged from today for evaluation of left-sided abdominal pain for the past 4 days.  Reports some nausea but no vomiting.  Reports watery diarrhea but started to have some darker red blood in his stools today.  He reports he is having around 5-10 episodes of diarrhea.  No melena.  No dysuria, hematuria, fevers, chills.  No recent travel or different foods he has tried.  He reports he has had symptoms with this for most told he had "ulcers" but was unable to explain further on this.  He denies any diagnosis of ulcerative colitis or Crohn's or peptic ulcer disease.  He denies any radiation into his back, or groin.  Denies any tenesmus.  Only has rectal pain with defecation.  He has had no abdominal surgeries before.  No daily medications.  No known drug allergies.  He does marijuana multiple times daily.   Abdominal Pain Associated symptoms: diarrhea and nausea   Associated symptoms: no chest pain, no chills, no dysuria, no fever, no hematuria, no shortness of breath and no vomiting        Home Medications Prior to Admission medications   Medication Sig Start Date End Date Taking? Authorizing Provider  amoxicillin-clavulanate (AUGMENTIN) 875-125 MG tablet Take 1 tablet by mouth every 12 (twelve) hours. 01/23/23  Yes Achille Rich, PA-C  ondansetron (ZOFRAN) 4 MG tablet Take 1 tablet (4 mg total) by mouth every 6 (six) hours. 01/23/23  Yes Achille Rich, PA-C  pantoprazole (PROTONIX) 20 MG tablet Take 1 tablet (20 mg total) by mouth daily. 10/04/17 11/03/17  Long, Arlyss Repress, MD  sucralfate (CARAFATE) 1 g tablet Take 1 tablet (1 g total) by mouth 4 (four) times daily -  with meals and at bedtime for 14 days. 10/04/17 10/18/17  Long,  Arlyss Repress, MD  triamcinolone cream (KENALOG) 0.1 % Apply 1 application topically 2 (two) times daily. 12/13/17   Charlestine Night, PA-C      Allergies    Patient has no known allergies.    Review of Systems   Review of Systems  Constitutional:  Negative for chills and fever.  Respiratory:  Negative for shortness of breath.   Cardiovascular:  Negative for chest pain.  Gastrointestinal:  Positive for abdominal pain, blood in stool, diarrhea and nausea. Negative for rectal pain and vomiting.  Genitourinary:  Negative for dysuria, frequency, hematuria and urgency.  Skin:  Negative for rash.    Physical Exam Updated Vital Signs BP 124/72 (BP Location: Left Arm)   Pulse 85   Temp 98.2 F (36.8 C) (Oral)   Resp 16   Ht 5\' 8"  (1.727 m)   Wt 63.5 kg   SpO2 100%   BMI 21.29 kg/m  Physical Exam Vitals and nursing note reviewed. Exam conducted with a chaperone present Drue Flirt, Charity fundraiser).  Constitutional:      General: He is not in acute distress.    Appearance: Normal appearance. He is not ill-appearing or toxic-appearing.  HENT:     Head: Normocephalic and atraumatic.  Eyes:     General: No scleral icterus. Cardiovascular:     Rate and Rhythm: Normal rate and regular rhythm.  Pulmonary:     Effort: Pulmonary effort is normal.  No respiratory distress.  Abdominal:     General: Abdomen is flat. Bowel sounds are normal.     Palpations: Abdomen is soft.     Tenderness: There is abdominal tenderness in the periumbilical area, left upper quadrant and left lower quadrant. There is no right CVA tenderness, left CVA tenderness, guarding or rebound.  Genitourinary:    Rectum: No mass, tenderness or external hemorrhoid. Normal anal tone.     Comments: No frank red blood seen on digital rectal exam.  No melena or dark stool.  Medium brown stool noted on exam.  I do not palpate any hemorrhoids.  Rectal tone normal.  No masses palpated. Musculoskeletal:        General: No deformity.     Cervical  back: Normal range of motion.  Skin:    General: Skin is warm and dry.  Neurological:     General: No focal deficit present.     Mental Status: He is alert. Mental status is at baseline.     ED Results / Procedures / Treatments   Labs (all labs ordered are listed, but only abnormal results are displayed) Labs Reviewed  COMPREHENSIVE METABOLIC PANEL - Abnormal; Notable for the following components:      Result Value   Glucose, Bld 106 (*)    Calcium 8.6 (*)    All other components within normal limits  URINALYSIS, ROUTINE W REFLEX MICROSCOPIC - Abnormal; Notable for the following components:   Protein, ur 30 (*)    All other components within normal limits  URINALYSIS, MICROSCOPIC (REFLEX) - Abnormal; Notable for the following components:   Bacteria, UA FEW (*)    All other components within normal limits  LIPASE, BLOOD  CBC  GC/CHLAMYDIA PROBE AMP ([place]) NOT AT [facility]    EKG None  Radiology CT ABDOMEN PELVIS W CONTRAST  Result Date: 01/23/2023 CLINICAL DATA:  Left lower quadrant abdominal pain, lower gastrointestinal hemorrhage EXAM: CT ABDOMEN AND PELVIS WITH CONTRAST TECHNIQUE: Multidetector CT imaging of the abdomen and pelvis was performed using the standard protocol following bolus administration of intravenous contrast. RADIATION DOSE REDUCTION: This exam was performed according to the departmental dose-optimization program which includes automated exposure control, adjustment of the mA and/or kV according to patient size and/or use of iterative reconstruction technique. CONTRAST:  OMNIPAQUE IOHEXOL 300 MG/ML  SOLN COMPARISON:  10/04/2017 FINDINGS: Lower chest: No acute abnormality. Hepatobiliary: No focal liver abnormality is seen. No gallstones, gallbladder wall thickening, or biliary dilatation. Pancreas: Unremarkable Spleen: Unremarkable Adrenals/Urinary Tract: Adrenal glands are unremarkable. Kidneys are normal, without renal calculi, focal lesion, or  hydronephrosis. Bladder is unremarkable. Stomach/Bowel: There is mild circumferential hyperemia and mucosal thickening involving the low rectum with venous vascular congestion noted, nonspecific but possibly reflecting changes of an inflammatory or infiltrative process. No evidence of obstruction. The stomach, small bowel, and large bowel are otherwise unremarkable. The appendix is not clearly identified, however, there are no secondary signs of appendicitis within the right lower quadrant. No free intraperitoneal gas or fluid. Vascular/Lymphatic: Mild atherosclerotic calcification noted within the right common iliac artery. No aortic aneurysm. No pathologic adenopathy within the abdomen and pelvis. Reproductive: Prostate is unremarkable. Other: No abdominal wall hernia Musculoskeletal: No acute bone abnormality. Remote T11 and T12 superior endplate fractures are seen, new since prior examination, but described on remote examination of 05/25/2020. No lytic or blastic bone lesion. IMPRESSION: 1. Mild circumferential hyperemia and mucosal thickening involving the low rectum with venous vascular congestion noted, nonspecific but  possibly reflecting changes of an inflammatory or infiltrative process. No evidence of obstruction. Correlation with clinical examination and possible endoscopy may be helpful for further evaluation. 2. Remote T11 and T12 superior endplate fractures, new since prior examination, but described on remote examination of 05/25/2020. 3. Minimal atherosclerotic calcification, abnormal in a patient of this age. Correlation for aggressive vascular risk factors is recommended. Electronically Signed   By: Helyn Numbers M.D.   On: 01/23/2023 16:35    Procedures Procedures   Medications Ordered in ED Medications  lactated ringers bolus 1,000 mL (0 mLs Intravenous Stopped 01/23/23 1732)  iohexol (OMNIPAQUE) 300 MG/ML solution 100 mL (100 mLs Intravenous Contrast Given 01/23/23 1616)    ED Course/  Medical Decision Making/ A&P    Medical Decision Making Amount and/or Complexity of Data Reviewed Labs: ordered. Radiology: ordered.  Risk Prescription drug management.   32 y.o. male presents to the ER for evaluation of aminal pain with hematochezia and nausea. Differential diagnosis includes but is not limited to AAA, mesenteric ischemia, appendicitis, diverticulitis, DKA, gastroenteritis, nephrolithiasis, pancreatitis, constipation, UTI, bowel obstruction, biliary disease, IBD, PUD, hepatitis. Vital signs unremarkable. Physical exam as noted above.   On chart review, patient was seen on 07-25-2015 for similar complaints with bloody stools.  He reports that he had a GI workup before.  He reports that he had a polyp that was removed.  He reports that he does not know if they told him to follow-up or not.  The patient does not appear to be any acute distress.  Will order CT and labs.  Liter IV fluid ordered as well.  I independently reviewed and interpreted the patient's labs.  CMP shows mildly elevated glucose at 106 and mildly decreased calcium 8.6.  Otherwise, no electrolyte or LFT abnormality.  Lipase within normal limits.  CBC without leukocytosis or anemia.  Urinalysis does show 30 protein in the urine as well as few bacteria but no white blood cells, red blood cells, nitrates, leukocytes present.  Patient does not have any urinary symptoms.  CT imaging shows 1. Mild circumferential hyperemia and mucosal thickening involving  the low rectum with venous vascular congestion noted, nonspecific  but possibly reflecting changes of an inflammatory or infiltrative  process. No evidence of obstruction. Correlation with clinical  examination and possible endoscopy may be helpful for further  evaluation.  2. Remote T11 and T12 superior endplate fractures, new since prior  examination, but described on remote examination of 05/25/2020.  3. Minimal atherosclerotic calcification, abnormal in a  patient of  this age. Correlation for aggressive vascular risk factors is  recommended.   Called radiology and spoke with radiologist on the reading. Question proctitis whether infectious or inflammatory. Will start him on Augmentin. Discussed possible steroids with my attending who recommends solo augmentin therapy.   Rectal exam unremarkable.  Patient vital signs are stable.  No leukocytosis.  No anemia. He denies any anal penetration or intercourse. Will have him follow-up with GI as he has had similar presentations before.  Information for [facility] given in the discharge paperwork.  Will provide him with some Zofran as needed for nausea.  Recommended bland diet and staying well-hydrated.  For the atherosclerotic calcifications, will have him follow-up with PCP.   We discussed the results of the labs/imaging. The plan is follow-up with GI, stay well-hydrated, bland diet. We discussed strict return precautions and red flag symptoms. The patient verbalized their understanding and agrees to the plan. The patient is stable and being  discharged home in good condition.  I discussed this case with my attending physician who cosigned this note including patient's presenting symptoms, physical exam, and planned diagnostics and interventions. Attending physician stated agreement with plan or made changes to plan which were implemented.   Portions of this report may have been transcribed using voice recognition software. Every effort was made to ensure accuracy; however, inadvertent computerized transcription errors may be present.   Final Clinical Impression(s) / ED Diagnoses Final diagnoses:  Abnormal CT scan  Nausea  Proctitis    Rx / DC Orders ED Discharge Orders          Ordered    ondansetron (ZOFRAN) 4 MG tablet  Every 6 hours        01/23/23 1932    amoxicillin-clavulanate (AUGMENTIN) 875-125 MG tablet  Every 12 hours        01/23/23 1932              Achille Rich,  Cordelia Poche 01/23/23 1935    Ernie Avena, MD 01/23/23 2140

## 2023-01-24 ENCOUNTER — Other Ambulatory Visit (HOSPITAL_BASED_OUTPATIENT_CLINIC_OR_DEPARTMENT_OTHER): Payer: Self-pay

## 2023-01-24 LAB — GC/CHLAMYDIA PROBE AMP (~~LOC~~) NOT AT ARMC
Chlamydia: NEGATIVE
Comment: NEGATIVE
Comment: NORMAL
Neisseria Gonorrhea: NEGATIVE

## 2023-02-06 ENCOUNTER — Ambulatory Visit (INDEPENDENT_AMBULATORY_CARE_PROVIDER_SITE_OTHER): Payer: 59

## 2023-02-06 ENCOUNTER — Other Ambulatory Visit: Payer: Self-pay

## 2023-02-06 VITALS — BP 117/66 | HR 73 | Temp 97.9°F | Ht 68.0 in | Wt 131.4 lb

## 2023-02-06 DIAGNOSIS — K921 Melena: Secondary | ICD-10-CM | POA: Diagnosis not present

## 2023-02-06 DIAGNOSIS — N069 Isolated proteinuria with unspecified morphologic lesion: Secondary | ICD-10-CM

## 2023-02-06 DIAGNOSIS — R800 Isolated proteinuria: Secondary | ICD-10-CM

## 2023-02-06 NOTE — Patient Instructions (Addendum)
[name], it was a pleasure seeing you today!  Today we discussed: Will refer to GI. Will call back if abnormal lab.  I have ordered the following labs today:  Lab Orders         Urinalysis, Reflex Microscopic       Tests ordered today:  none  Referrals ordered today:   Referral Orders  No referral(s) requested today     I have ordered the following medication/changed the following medications:   Stop the following medications: Medications Discontinued During This Encounter  Medication Reason   pantoprazole (PROTONIX) 20 MG tablet Completed Course   sucralfate (CARAFATE) 1 g tablet Completed Course     Start the following medications: No orders of the defined types were placed in this encounter.    Follow-up: 1 year   Please make sure to arrive 15 minutes prior to your next appointment. If you arrive late, you may be asked to reschedule.   We look forward to seeing you next time. Please call our clinic at [phone] if you have any questions or concerns. The best time to call is Monday-Friday from 9am-4pm, but there is someone available 24/7. If after hours or the weekend, call the main hospital number and ask for the Internal Medicine Resident On-Call. If you need medication refills, please notify your pharmacy one week in advance and they will send [place] a request.  Thank you for letting us take part in your care. Wishing you the best!  Thank you, Adron Bene, MD

## 2023-02-06 NOTE — Assessment & Plan Note (Signed)
Patient presents for hospital follow-up after having been recently seen in the ED for blood in stool.  He reports that for many years he has had occasional bouts of bloody bowel movements ranging from melena to hematochezia.  He states this has been gone for several days now but recent bout included hematochezia, melena, abdominal pain, nausea, vomiting (without hematemesis).  Denies fever, chills, diarrhea.  Initially had symptoms in 2015 and proceeded to colonoscopy which showed a polyp but no signs of IBD.  At this point, he has had CTAP on 2 occasions which revealed mucosal inflammation/thickening initially at the pylorus and then more recently at the rectum.  He also reports a mass that will protrude when he has bowel movements and he has to press back into his rectum.  He did not have a significant anemia or leukocytosis during recent ED visit.  Today exam is nonrevealing.  No signs of external hemorrhoids.  Recent CT findings concerning for possible IBD. -Referral to gastroenterology
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
## 2023-02-07 LAB — URINALYSIS, ROUTINE W REFLEX MICROSCOPIC
Bilirubin, UA: NEGATIVE
Glucose, UA: NEGATIVE
Nitrite, UA: NEGATIVE
RBC, UA: NEGATIVE
Specific Gravity, UA: 1.025 (ref 1.005–1.030)
Urobilinogen, Ur: 1 mg/dL (ref 0.2–1.0)
pH, UA: 7.5 (ref 5.0–7.5)

## 2023-02-07 LAB — MICROSCOPIC EXAMINATION
Bacteria, UA: NONE SEEN
Casts: NONE SEEN /lpf
Epithelial Cells (non renal): NONE SEEN /hpf (ref 0–10)

## 2023-02-07 NOTE — Progress Notes (Signed)
Internal Medicine Clinic Attending  Case discussed with Dr. White  At the time of the visit.  We reviewed the resident's history and exam and pertinent patient test results.  I agree with the assessment, diagnosis, and plan of care documented in the resident's note.  

## 2023-06-14 ENCOUNTER — Encounter: Payer: Self-pay | Admitting: Gastroenterology

## 2023-09-18 ENCOUNTER — Encounter: Payer: Self-pay | Admitting: Gastroenterology

## 2023-09-18 ENCOUNTER — Ambulatory Visit (INDEPENDENT_AMBULATORY_CARE_PROVIDER_SITE_OTHER): Payer: 59 | Admitting: Gastroenterology

## 2023-09-18 VITALS — BP 120/80 | HR 83 | Ht 68.0 in | Wt 128.2 lb

## 2023-09-18 DIAGNOSIS — K5900 Constipation, unspecified: Secondary | ICD-10-CM

## 2023-09-18 DIAGNOSIS — K219 Gastro-esophageal reflux disease without esophagitis: Secondary | ICD-10-CM

## 2023-09-18 DIAGNOSIS — K625 Hemorrhage of anus and rectum: Secondary | ICD-10-CM | POA: Insufficient documentation

## 2023-09-18 MED ORDER — OMEPRAZOLE 40 MG PO CPDR: 40.0000 mg | DELAYED_RELEASE_CAPSULE | Freq: Every day | ORAL | 3 refills | Status: DC

## 2023-09-18 MED ORDER — NA SULFATE-K SULFATE-MG SULF 17.5-3.13-1.6 GM/177ML PO SOLN: 1.0000 | Freq: Once | ORAL | 0 refills | Status: AC

## 2023-09-18 NOTE — Patient Instructions (Signed)
We have sent the following medications to your pharmacy for you to pick up at your convenience: Omeprazole 40 mg daily 30-60 minutes before breakfast.   Start Miralax 1 capful daily in 8 ounces of liquid.  You have been scheduled for an endoscopy and colonoscopy. Please follow the written instructions given to you at your visit today.  Please pick up your prep supplies at the pharmacy within the next 1-3 days.  If you use inhalers (even only as needed), please bring them with you on the day of your procedure.  DO NOT TAKE 7 DAYS PRIOR TO TEST- Trulicity (dulaglutide) Ozempic, Wegovy (semaglutide) Mounjaro (tirzepatide) Bydureon Bcise (exanatide extended release)  DO NOT TAKE 1 DAY PRIOR TO YOUR TEST Rybelsus (semaglutide) Adlyxin (lixisenatide) Victoza (liraglutide) Byetta (exanatide) _______________________________________________________________

## 2023-09-18 NOTE — Progress Notes (Signed)
09/18/2023 [place] BRONZE DOLLENS [id number] 1991/03/12   HISTORY OF PRESENT ILLNESS: This is a 33 year old male who is new to our office.  He has been referred here by Dr. Lafonda Mosses for evaluation of rectal bleeding.  He tells me that he has been having red rectal bleeding on the toilet paper and in the toilet bowl for years.  He says that sometimes it is light in color, sometimes darker in color.  He has also had issues with constipation.  He remembers being younger and straining a lot, sitting on the toilet for a long period time, etc.  He says that sometimes he will go 3 days without a bowel movement.  Reports some intermittent abdominal cramping on the sides of his abdomen, sometimes reaching 7 out of 10 on the pain scale.  He has also been experiencing intermittent nausea with vomiting.  Has intermittent indigestion and burning sensations after meals.  Says that he probably does not drink enough water, but previously dietary changes had not helped his constipation.   EGD (normal)/Colonoscopy (07/21/2014) revealed a 3mm benign inflammatory polyp (cecum) and (moderate to large) internal hemorrhoids.  A. STOMACH BIOPSY:  BENIGN GASTRIC MUCOSA WITH NO SIGNIFICANT INFLAMMATION.   DIFF-QUIK STAIN NEGATIVE FOR HELICOBACTER.   B. ILEUM BIOPSY:  BENIGN SMALL BOWEL MUCOSA WITH BENIGN LYMPHOID  HYPERPLASIA.   NO EVIDENCE OF CROHN'S DISEASE OR CELIAC SPRUE.   C. CECAL POLYP:  BENIGN INFLAMMATORY POLYP.    Past Medical History:  Diagnosis Date   History of bleeding ulcers    History reviewed. No pertinent surgical history.  reports that he has been smoking cigarettes. He has never used smokeless tobacco. He reports current drug use. Drug: Marijuana. He reports that he does not drink alcohol. family history is not on file. No Known Allergies    Outpatient Encounter Medications as of 09/18/2023  Medication Sig   [DISCONTINUED] amoxicillin-clavulanate (AUGMENTIN) 875-125 MG tablet Take 1 tablet by  mouth every 12 (twelve) hours.   [DISCONTINUED] ondansetron (ZOFRAN) 4 MG tablet Take 1 tablet (4 mg total) by mouth every 6 (six) hours.   [DISCONTINUED] triamcinolone cream (KENALOG) 0.1 % Apply 1 application topically 2 (two) times daily.   No facility-administered encounter medications on file as of 09/18/2023.     REVIEW OF SYSTEMS  : All other systems reviewed and negative except where noted in the History of Present Illness.   PHYSICAL EXAM: BP 120/80 (BP Location: Left Arm, Patient Position: Sitting, Cuff Size: Normal)   Pulse 83   Ht 5\' 8"  (1.727 m)   Wt 128 lb 4 oz (58.2 kg)   SpO2 97%   BMI 19.50 kg/m  General: Well developed male in no acute distress Head: Normocephalic and atraumatic Eyes:  Sclerae anicteric, conjunctiva pink. Ears: Normal auditory acuity Lungs: Clear throughout to auscultation; no W/R/R. Heart: Regular rate and rhythm; no M/R/G. Abdomen: Soft, non-distended.  BS present.  Minimal TTP in mid-abdomen. Rectal:  Will be done at the time of colonoscopy. Musculoskeletal: Symmetrical with no gross deformities  Skin: No lesions on visible extremities Extremities: No edema  Neurological: Alert oriented x 4, grossly non-focal Psychological:  Alert and cooperative. Normal mood and affect  ASSESSMENT AND PLAN: *Rectal bleeding: Likely due to the medium to large size internal hemorrhoids that they noted on his colonoscopy in November 2015.  He did also have an inflammatory polyp that was removed in November 2015.  Will repeat colonoscopy, but if nothing else found then we discussed internal  hemorrhoid banding and he was given literature on this. *Constipation: Has dealt with some constipation and straining on and off throughout his life.  Not currently using anything for constipation.  Will start MiraLAX once daily.  He understands ultimately moving his bowels better will prevent recurrence of issues with hemorrhoids, etc. *GERD: EGD in November 2015 looked good.   Having a lot of indigestion, etc. recently.  Not taking anything.  Will start omeprazole 40 mg daily.  Prescription sent to pharmacy.  Will plan to repeat EGD for now as well.  **EGD and colonoscopy scheduled with Dr. Barron Alvine.  The risks, benefits, and alternatives to EGD and colonoscopy were discussed with the patient and he consents to proceed.   CC:  Melvin James, DO CC:  Dr. Lafonda Mosses

## 2023-09-23 NOTE — Progress Notes (Signed)
Agree with the assessment and plan as outlined by Doug Sou, PA-C. ? ?Aubriegh Minch, DO, FACG ? ?

## 2023-10-10 ENCOUNTER — Encounter: Payer: Self-pay | Admitting: Gastroenterology

## 2023-10-18 ENCOUNTER — Encounter: Payer: 59 | Admitting: Gastroenterology

## 2023-10-18 ENCOUNTER — Telehealth: Payer: Self-pay | Admitting: Gastroenterology

## 2023-10-18 NOTE — Telephone Encounter (Signed)
 Good afternoon Dr. Barron Alvine,   Patient called stating that he would not be coming in for his endoscopy and colonoscopy with you today at 1:30 due to not realizing he was not supposed to eat yesterday and forgot to call to cancel.    Patient was rescheduled for  3/19 at 2:30

## 2023-10-18 NOTE — Telephone Encounter (Signed)
 New instructions printed and mailed to pt. He states he still has the prep from today's procedure. Asked pt to read instructions thoroughly and to call if he has any questions. Pt verbalized understanding.

## 2023-10-30 ENCOUNTER — Encounter: Payer: 59 | Admitting: Internal Medicine

## 2023-11-14 ENCOUNTER — Ambulatory Visit: Payer: 59 | Admitting: Gastroenterology

## 2023-11-14 ENCOUNTER — Encounter: Payer: Self-pay | Admitting: Gastroenterology

## 2023-11-14 VITALS — BP 119/84 | HR 82 | Temp 98.2°F | Resp 11 | Ht 68.0 in | Wt 128.0 lb

## 2023-11-14 DIAGNOSIS — K5900 Constipation, unspecified: Secondary | ICD-10-CM | POA: Diagnosis not present

## 2023-11-14 DIAGNOSIS — K2950 Unspecified chronic gastritis without bleeding: Secondary | ICD-10-CM

## 2023-11-14 DIAGNOSIS — K625 Hemorrhage of anus and rectum: Secondary | ICD-10-CM

## 2023-11-14 DIAGNOSIS — K642 Third degree hemorrhoids: Secondary | ICD-10-CM | POA: Diagnosis not present

## 2023-11-14 DIAGNOSIS — K2970 Gastritis, unspecified, without bleeding: Secondary | ICD-10-CM

## 2023-11-14 DIAGNOSIS — K219 Gastro-esophageal reflux disease without esophagitis: Secondary | ICD-10-CM

## 2023-11-14 MED ORDER — SODIUM CHLORIDE 0.9 % IV SOLN
500.0000 mL | Freq: Once | INTRAVENOUS | Status: DC
Start: 2023-11-14 — End: 2023-11-14

## 2023-11-14 NOTE — Op Note (Signed)
 [facility] Patient Name: Melvin Levine Procedure Date: 11/14/2023 3:54 PM MRN: [id number] Endoscopist: Doristine Locks , MD, [id number] Age: 33 Referring MD:  Date of Birth: 1990/09/08 Gender: Male Account #: [id number] Procedure:                Colonoscopy Indications:              Hematochezia, Change in bowel habits, Constipation Medicines:                Monitored Anesthesia Care Procedure:                Pre-Anesthesia Assessment:                           - Prior to the procedure, a History and Physical                            was performed, and patient medications and                            allergies were reviewed. The patient's tolerance of                            previous anesthesia was also reviewed. The risks                            and benefits of the procedure and the sedation                            options and risks were discussed with the patient.                            All questions were answered, and informed consent                            was obtained. Prior Anticoagulants: The patient has                            taken no anticoagulant or antiplatelet agents. ASA                            Grade Assessment: II - A patient with mild systemic                            disease. After reviewing the risks and benefits,                            the patient was deemed in satisfactory condition to                            undergo the procedure.                           After obtaining informed consent, the colonoscope  was passed under direct vision. Throughout the                            procedure, the patient's blood pressure, pulse, and                            oxygen saturations were monitored continuously. The                            Olympus Scope SN [id number] was introduced through the                            anus and advanced to the the cecum, identified by                             appendiceal orifice and ileocecal valve. The                            colonoscopy was performed without difficulty. The                            patient tolerated the procedure well. The quality                            of the bowel preparation was good. The ileocecal                            valve, appendiceal orifice, and rectum were                            photographed. Scope In: 4:11:24 PM Scope Out: 4:24:45 PM Scope Withdrawal Time: 0 hours 7 minutes 42 seconds  Total Procedure Duration: 0 hours 13 minutes 21 seconds  Findings:                 Hemorrhoids were found on perianal exam.                           The entire colon otherwise appeared normal.                           Non-bleeding internal hemorrhoids were found during                            retroflexion. The hemorrhoids were medium-sized and                            Grade III (internal hemorrhoids that prolapse but                            require manual reduction). Complications:            No immediate complications. Estimated Blood Loss:     Estimated blood loss: none. Impression:               - Hemorrhoids found on perianal  exam.                           - The entire examined colon is normal.                           - Non-bleeding internal hemorrhoids.                           - No specimens collected. Recommendation:           - Patient has a contact number available for                            emergencies. The signs and symptoms of potential                            delayed complications were discussed with the                            patient. Return to normal activities tomorrow.                            Written discharge instructions were provided to the                            patient.                           - Resume previous diet.                           - Continue present medications.                           - Repeat colonoscopy at age 74 for screening                             purposes.                           - Use fiber, for example Citrucel, Fibercon, Konsyl                            or Metamucil. Doristine Locks, MD 11/14/2023 4:40:00 PM

## 2023-11-14 NOTE — Patient Instructions (Signed)
 Increase prilosec to twice daily for 6 weeks, then can reduce back to once per day.  YOU HAD AN ENDOSCOPIC PROCEDURE TODAY AT THE [facility]:   Refer to the procedure report that was given to you for any specific questions about what was found during the examination.  If the procedure report does not answer your questions, please call your gastroenterologist to clarify.  If you requested that your care partner not be given the details of your procedure findings, then the procedure report has been included in a sealed envelope for you to review at your convenience later.  YOU SHOULD EXPECT: Some feelings of bloating in the abdomen. Passage of more gas than usual.  Walking can help get rid of the air that was put into your GI tract during the procedure and reduce the bloating. If you had a lower endoscopy (such as a colonoscopy or flexible sigmoidoscopy) you may notice spotting of blood in your stool or on the toilet paper. If you underwent a bowel prep for your procedure, you may not have a normal bowel movement for a few days.  Please Note:  You might notice some irritation and congestion in your nose or some drainage.  This is from the oxygen used during your procedure.  There is no need for concern and it should clear up in a day or so.  SYMPTOMS TO REPORT IMMEDIATELY:  Following lower endoscopy (colonoscopy or flexible sigmoidoscopy):  Excessive amounts of blood in the stool  Significant tenderness or worsening of abdominal pains  Swelling of the abdomen that is new, acute  Fever of 100F or higher  Following upper endoscopy (EGD)  Vomiting of blood or coffee ground material  New chest pain or pain under the shoulder blades  Painful or persistently difficult swallowing  New shortness of breath  Fever of 100F or higher  Black, tarry-looking stools  For urgent or emergent issues, a gastroenterologist can be reached at any hour by calling [phone]. Do not use MyChart  messaging for urgent concerns.    DIET:  We do recommend a small meal at first, but then you may proceed to your regular diet.  Drink plenty of fluids but you should avoid alcoholic beverages for 24 hours.  ACTIVITY:  You should plan to take it easy for the rest of today and you should NOT DRIVE or use heavy machinery until tomorrow (because of the sedation medicines used during the test).    FOLLOW UP: Our staff will call the number listed on your records the next business day following your procedure.  We will call around 7:15- 8:00 am to check on you and address any questions or concerns that you may have regarding the information given to you following your procedure. If we do not reach you, we will leave a message.     If any biopsies were taken you will be contacted by phone or by letter within the next 1-3 weeks.  Please call us at [phone] if you have not heard about the biopsies in 3 weeks.    SIGNATURES/CONFIDENTIALITY: You and/or your care partner have signed paperwork which will be entered into your electronic medical record.  These signatures attest to the fact that that the information above on your After Visit Summary has been reviewed and is understood.  Full responsibility of the confidentiality of this discharge information lies with you and/or your care-partner.

## 2023-11-14 NOTE — Progress Notes (Unsigned)
 Called to room to assist during endoscopic procedure.  Patient ID and intended procedure confirmed with present staff. Received instructions for my participation in the procedure from the performing physician.

## 2023-11-14 NOTE — Progress Notes (Unsigned)
 Pt reports throat is a little swollen from seasonal allergies. Denies flu. Cold, or fever.  CRNA notified

## 2023-11-14 NOTE — Progress Notes (Unsigned)
 Vss nad trans to pacu

## 2023-11-14 NOTE — Op Note (Signed)
 [facility] Patient Name: Melvin Levine Procedure Date: 11/14/2023 3:54 PM MRN: [id number] Endoscopist: Doristine Locks , MD, [id number] Age: 33 Referring MD:  Date of Birth: 06/26/1991 Gender: Male Account #: [id number] Procedure:                Upper GI endoscopy Indications:              Generalized abdominal pain, Suspected esophageal                            reflux, Nausea with vomiting Medicines:                Monitored Anesthesia Care Procedure:                Pre-Anesthesia Assessment:                           - Prior to the procedure, a History and Physical                            was performed, and patient medications and                            allergies were reviewed. The patient's tolerance of                            previous anesthesia was also reviewed. The risks                            and benefits of the procedure and the sedation                            options and risks were discussed with the patient.                            All questions were answered, and informed consent                            was obtained. Prior Anticoagulants: The patient has                            taken no anticoagulant or antiplatelet agents. ASA                            Grade Assessment: II - A patient with mild systemic                            disease. After reviewing the risks and benefits,                            the patient was deemed in satisfactory condition to                            undergo the procedure.  After obtaining informed consent, the endoscope was                            passed under direct vision. Throughout the                            procedure, the patient's blood pressure, pulse, and                            oxygen saturations were monitored continuously. The                            Olympus Scope [id number] was introduced through the                            mouth, and advanced to the  second part of duodenum.                            The upper GI endoscopy was accomplished without                            difficulty. The patient tolerated the procedure                            well. Scope In: Scope Out: Findings:                 The examined esophagus was normal.                           The Z-line was regular and was found 35 cm from the                            incisors.                           Patchy mild inflammation characterized by                            congestion (edema) and erythema was found in the                            gastric body and in the gastric antrum. Biopsies                            were taken with a cold forceps for Helicobacter                            pylori testing. Estimated blood loss was minimal.                           The examined duodenum was normal. Complications:            No immediate complications. Estimated Blood Loss:     Estimated blood loss was minimal. Impression:               -  Normal esophagus.                           - Z-line regular, 35 cm from the incisors.                           - Gastritis. Biopsied.                           - Normal examined duodenum. Recommendation:           - Patient has a contact number available for                            emergencies. The signs and symptoms of potential                            delayed complications were discussed with the                            patient. Return to normal activities tomorrow.                            Written discharge instructions were provided to the                            patient.                           - Resume previous diet.                           - Continue present medications.                           - Await pathology results.                           - Increase Prilosec (omeprazole) 40 mg PO BID for 6                            weeks then can reduce back to 40 mg daily.                           -  Return to GI clinic PRN. Doristine Locks, MD 11/14/2023 4:36:38 PM

## 2023-11-14 NOTE — Progress Notes (Unsigned)
 GASTROENTEROLOGY PROCEDURE H&P NOTE   Primary Care Physician: Katheran James, DO    Reason for Procedure:  GERD, BRBPR, change in bowel habits, abdominal cramping, nausea/vomiting  Plan:    EGD, colonoscopy  Patient is appropriate for endoscopic procedure(s) in the ambulatory ([facility]) setting.  The nature of the procedure, as well as the risks, benefits, and alternatives were carefully and thoroughly reviewed with the patient. Ample time for discussion and questions allowed. The patient understood, was satisfied, and agreed to proceed.     HPI: Melvin Levine is a 33 y.o. male who presents for EGD and colonoscopy for evaluation of GERD, nausea/vomiting, change in bowel habits, constipation, hematochezia.  No significant changes in clinical history since last office appointment on 09/18/2023.  Past Medical History:  Diagnosis Date   Allergy    History of bleeding ulcers     Past Surgical History:  Procedure Laterality Date   MANDIBLE SURGERY  2019   SHOULDER ARTHROSCOPY W/ ROTATOR CUFF REPAIR Left 2019    Prior to Admission medications   Medication Sig Start Date End Date Taking? Authorizing Provider  omeprazole (PRILOSEC) 40 MG capsule Take 1 capsule (40 mg total) by mouth daily. 09/18/23   Zehr, Princella Pellegrini, PA-C    Current Outpatient Medications  Medication Sig Dispense Refill   omeprazole (PRILOSEC) 40 MG capsule Take 1 capsule (40 mg total) by mouth daily. 90 capsule 3   Current Facility-Administered Medications  Medication Dose Route Frequency Provider Last Rate Last Admin   0.9 %  sodium chloride infusion  500 mL Intravenous Once Tina Temme V, DO        Allergies as of 11/14/2023   (No Known Allergies)    Family History  Problem Relation Age of Onset   Colon cancer Neg Hx    Esophageal cancer Neg Hx    Rectal cancer Neg Hx    Stomach cancer Neg Hx     Social History   Socioeconomic History   Marital status: Single    Spouse name: Not on file    Number of children: Not on file   Years of education: Not on file   Highest education level: Not on file  Occupational History   Not on file  Tobacco Use   Smoking status: Every Day    Current packs/day: 0.50    Types: Cigarettes   Smokeless tobacco: Never  Vaping Use   Vaping status: Never Used  Substance and Sexual Activity   Alcohol use: No   Drug use: Yes    Types: Marijuana   Sexual activity: Not on file  Other Topics Concern   Not on file  Social History Narrative   Not on file   Social Drivers of Health   Financial Resource Strain: Low Risk  (02/06/2023)   Overall Financial Resource Strain (CARDIA)    Difficulty of Paying Living Expenses: Not hard at all  Food Insecurity: No Food Insecurity (02/06/2023)   Hunger Vital Sign    Worried About Running Out of Food in the Last Year: Never true    Ran Out of Food in the Last Year: Never true  Transportation Needs: No Transportation Needs (02/06/2023)   PRAPARE - Administrator, Civil Service (Medical): No    Lack of Transportation (Non-Medical): No  Physical Activity: Inactive (02/06/2023)   Exercise Vital Sign    Days of Exercise per Week: 0 days    Minutes of Exercise per Session: 0 min  Stress: No Stress  Concern Present (02/06/2023)   [employer] of Occupational Health - Occupational Stress Questionnaire    Feeling of Stress : Not at all  Social Connections: Moderately Integrated (02/06/2023)   Social Connection and Isolation Panel [NHANES]    Frequency of Communication with Friends and Family: More than three times a week    Frequency of Social Gatherings with Friends and Family: More than three times a week    Attends Religious Services: More than 4 times per year    Active Member of [employer] or Organizations: No    Attends Banker Meetings: Never    Marital Status: Living with partner  Intimate Partner Violence: Not At Risk (02/06/2023)   Humiliation, Afraid, Rape, and Kick questionnaire     Fear of Current or Ex-Partner: No    Emotionally Abused: No    Physically Abused: No    Sexually Abused: No    Physical Exam: Vital signs in last 24 hours: @BP  100/60   Pulse 90   Temp 98.2 F (36.8 C) (Temporal)   Ht 5\' 8"  (1.727 m)   Wt 128 lb (58.1 kg)   SpO2 98%   BMI 19.46 kg/m  GEN: NAD EYE: Sclerae anicteric ENT: MMM CV: Non-tachycardic Pulm: CTA b/l GI: Soft, NT/ND NEURO:  Alert & Oriented x 3   Doristine Locks, DO [facility]   11/14/2023 3:51 PM

## 2023-11-15 ENCOUNTER — Telehealth: Payer: Self-pay

## 2023-11-15 NOTE — Telephone Encounter (Signed)
 Follow up call to pt, lm for pt to call if having any difficulty with normal activities or eating and drinking.  Also to call if any other questions or concerns.

## 2023-11-20 LAB — SURGICAL PATHOLOGY

## 2023-11-28 ENCOUNTER — Other Ambulatory Visit: Payer: Self-pay

## 2023-11-28 ENCOUNTER — Other Ambulatory Visit (HOSPITAL_BASED_OUTPATIENT_CLINIC_OR_DEPARTMENT_OTHER): Payer: Self-pay

## 2023-11-28 DIAGNOSIS — K2970 Gastritis, unspecified, without bleeding: Secondary | ICD-10-CM

## 2023-11-28 DIAGNOSIS — Z8619 Personal history of other infectious and parasitic diseases: Secondary | ICD-10-CM

## 2023-11-28 MED ORDER — METRONIDAZOLE 250 MG PO TABS
250.0000 mg | ORAL_TABLET | Freq: Four times a day (QID) | ORAL | 0 refills | Status: AC
  Filled 2023-11-28: qty 56, 14d supply, fill #0

## 2023-11-28 MED ORDER — BISMUTH SUBSALICYLATE 262 MG PO CHEW
524.0000 mg | CHEWABLE_TABLET | Freq: Four times a day (QID) | ORAL | 0 refills | Status: AC
  Filled 2023-11-28: qty 120, 120d supply, fill #0

## 2023-11-28 MED ORDER — OMEPRAZOLE 40 MG PO CPDR
40.0000 mg | DELAYED_RELEASE_CAPSULE | Freq: Two times a day (BID) | ORAL | 0 refills | Status: AC
  Filled 2023-11-28: qty 28, 14d supply, fill #0

## 2023-11-28 MED ORDER — DOXYCYCLINE HYCLATE 100 MG PO TABS
100.0000 mg | ORAL_TABLET | Freq: Two times a day (BID) | ORAL | 0 refills | Status: AC
  Filled 2023-11-28: qty 28, 14d supply, fill #0

## 2023-11-28 NOTE — Progress Notes (Signed)
 Patient notified of pathology results per Dr Barron Alvine.  Patient advised that he should do the following:  1- Check H. pylori stool antigen via [facility].  Stool kit left at front desk for pick up.   2- After completion of [facility] stool study, patient should start quadruple therapy:   1) Resume Prilosec as prescribed at the time of the upper endoscopy  2) Pepto Bismol 2 tabs (262 mg each) 4 times a day x 14 d  3) Metronidazole 250 mg 4 times a day x 14 d  4) doxycycline 100 mg 2 times a day x 14 d   Patient advised after 14 days, ok to stop omeprazole.   Patient advised to repeat upper endoscopy with repeat biopsies to confirm no H. pylori and evaluate for appropriate mucosal and histologic healing after high-dose PPI and quadruple therapy. EGD is scheduled for 01-23-24 at 10:30am.  Instructions printed and left at front desk for patient pick up.    Patient agreed to plan and verbalized understanding.  No further questions.

## 2023-11-29 ENCOUNTER — Other Ambulatory Visit (HOSPITAL_BASED_OUTPATIENT_CLINIC_OR_DEPARTMENT_OTHER): Payer: Self-pay

## 2023-11-30 ENCOUNTER — Other Ambulatory Visit (HOSPITAL_BASED_OUTPATIENT_CLINIC_OR_DEPARTMENT_OTHER): Payer: Self-pay

## 2023-12-06 ENCOUNTER — Other Ambulatory Visit

## 2023-12-06 DIAGNOSIS — Z8619 Personal history of other infectious and parasitic diseases: Secondary | ICD-10-CM

## 2023-12-06 DIAGNOSIS — K2970 Gastritis, unspecified, without bleeding: Secondary | ICD-10-CM

## 2023-12-18 ENCOUNTER — Telehealth: Payer: Self-pay

## 2023-12-18 NOTE — Telephone Encounter (Signed)
 Patient aware of negative [facility] results per [facility], NP.  Patient advised he should proceed with EGD scheduled for 01-23-24 at 10:30am.  Patient agreed to plan and verbalized understanding.

## 2023-12-18 NOTE — Telephone Encounter (Signed)
-----   Message from Devin Foerster May sent at 12/13/2023  4:15 PM EDT ----- Regarding: h pylori test Peterson Brandt, Please let patient know [facility] H. pylori breath test is negative. Deanna,NP

## 2023-12-31 ENCOUNTER — Encounter: Payer: Self-pay | Admitting: Gastroenterology

## 2024-01-23 ENCOUNTER — Encounter: Admitting: Gastroenterology

## 2024-01-23 ENCOUNTER — Telehealth: Payer: Self-pay | Admitting: Gastroenterology

## 2024-01-23 NOTE — Telephone Encounter (Signed)
 Good Morning Dr. Karene Oto,   We called this patient today at 10:51 am to and left a message for him to call back if he was running late or needed to reschedule.   I will NO SHOW him.  Medicaid

## 2024-07-06 ENCOUNTER — Other Ambulatory Visit: Payer: Self-pay

## 2024-07-06 ENCOUNTER — Encounter (HOSPITAL_BASED_OUTPATIENT_CLINIC_OR_DEPARTMENT_OTHER): Payer: Self-pay | Admitting: Emergency Medicine

## 2024-07-06 ENCOUNTER — Emergency Department (HOSPITAL_BASED_OUTPATIENT_CLINIC_OR_DEPARTMENT_OTHER)
Admission: EM | Admit: 2024-07-06 | Discharge: 2024-07-06 | Disposition: A | Discharge status: Home / Self Care | Attending: Emergency Medicine | Admitting: Emergency Medicine

## 2024-07-06 DIAGNOSIS — J029 Acute pharyngitis, unspecified: Secondary | ICD-10-CM | POA: Diagnosis present

## 2024-07-06 DIAGNOSIS — J020 Streptococcal pharyngitis: Secondary | ICD-10-CM | POA: Diagnosis not present

## 2024-07-06 LAB — GROUP A STREP BY PCR: Group A Strep by PCR: DETECTED — AB

## 2024-07-06 MED ORDER — KETOROLAC TROMETHAMINE 15 MG/ML IJ SOLN
15.0000 mg | Freq: Once | INTRAMUSCULAR | Status: AC
Start: 2024-07-06 — End: 2024-07-06
  Administered 2024-07-06: 15 mg via INTRAMUSCULAR
  Filled 2024-07-06: qty 1

## 2024-07-06 MED ORDER — PENICILLIN V POTASSIUM 250 MG PO TABS
500.0000 mg | ORAL_TABLET | Freq: Once | ORAL | Status: AC
  Administered 2024-07-06: 500 mg via ORAL
  Filled 2024-07-06: qty 2

## 2024-07-06 MED ORDER — PENICILLIN V POTASSIUM 500 MG PO TABS: 500.0000 mg | ORAL_TABLET | Freq: Two times a day (BID) | ORAL | 0 refills | Status: AC

## 2024-07-06 NOTE — Discharge Instructions (Signed)
 You have strep.  I have sent antibiotic into the pharmacy for you.  You can continue taking Tylenol.  Take ibuprofen 600 mg as well to help as you need it.  Do not take more than 5 days worth of this.  You can do warm salt water gargling.  You can also take cough drops these things may help soothe the pain.  Return for any emergent symptoms.  Drink plenty of fluids.  Cut back on your caffeine intake until this resolves then your intake goes back to normal.

## 2024-07-06 NOTE — ED Provider Notes (Signed)
 [facility] Provider Note   CSN: [id number] Arrival date & time: 07/06/24  1949     Patient presents with: Sore Throat   Melvin Levine is a 33 y.o. male.   33 year old male presents today for concern of pharyngitis.  Symptoms started last night.  He has had some soda to drink and hot chicken to eat today.  Otherwise he states it has been significantly painful to swallow.  No fever.  Has taken some Tylenol.  No other issues or complaints currently.  The history is provided by the patient and a significant other. No language interpreter was used.       Prior to Admission medications   Medication Sig Start Date End Date Taking? Authorizing Provider  penicillin  v potassium (VEETID) 500 MG tablet Take 1 tablet (500 mg total) by mouth in the morning and at bedtime for 10 days. 07/06/24 07/16/24 Yes Joscelyne Renville, PA-C  omeprazole  (PRILOSEC) 40 MG capsule Take 1 capsule (40 mg total) by mouth in the morning and at bedtime for 14 days. 11/28/23 12/14/23  Cirigliano, Vito V, DO    Allergies: Patient has no known allergies.    Review of Systems  HENT:  Positive for sore throat and trouble swallowing (Pain with swallowing). Negative for drooling.   Respiratory:  Negative for cough and shortness of breath.     Updated Vital Signs BP (!) 130/90   Pulse 88   Temp 99.4 F (37.4 C)   Resp 16   Ht 5' 7 (1.702 m)   Wt 59 kg   SpO2 100%   BMI 20.36 kg/m   Physical Exam Vitals and nursing note reviewed.  Constitutional:      General: He is not in acute distress.    Appearance: Normal appearance. He is not ill-appearing.  HENT:     Head: Normocephalic and atraumatic.     Nose: Nose normal.     Mouth/Throat:     Mouth: Mucous membranes are moist.     Pharynx: Oropharyngeal exudate and posterior oropharyngeal erythema present.  Eyes:     Conjunctiva/sclera: Conjunctivae normal.  Pulmonary:     Effort: Pulmonary effort is normal. No  respiratory distress.  Musculoskeletal:        General: No deformity.  Skin:    Findings: No rash.  Neurological:     Mental Status: He is alert.     (all labs ordered are listed, but only abnormal results are displayed) Labs Reviewed  GROUP A STREP BY PCR - Abnormal; Notable for the following components:      Result Value   Group A Strep by PCR DETECTED (*)    All other components within normal limits    EKG: None  Radiology: No results found.   Procedures   Medications Ordered in the ED  penicillin  v potassium (VEETID) tablet 500 mg (500 mg Oral Given 07/06/24 2121)  ketorolac  (TORADOL ) 15 MG/ML injection 15 mg (15 mg Intramuscular Given 07/06/24 2122)                                    Medical Decision Making Risk Prescription drug management.   33 year old male presents today for concern of pharyngitis.  Symptoms started yesterday.  Strep swab is positive.  Will start him on penicillin .  First dose given in the emergency department.  Toradol  given in the emergency department.  Supportive  care discussed. Exam overall reassuring.  He does have some exudates. Patient and significant other voiced understanding and they are both in agreement with plan. Patient discharged in stable condition.  Discussed importance of hydration.   Final diagnoses:  Strep pharyngitis    ED Discharge Orders          Ordered    penicillin  v potassium (VEETID) 500 MG tablet  2 times daily        07/06/24 2118               Hildegard Loge, PA-C 07/06/24 2129    Emil Share, DO 07/06/24 2136

## 2024-07-06 NOTE — ED Triage Notes (Signed)
Pt c/o sore throat since last night.
